# Patient Record
Sex: Female | Born: 1935 | Race: White | Hispanic: No | Smoking: Never smoker
Health system: Southern US, Community
[De-identification: ages and names within clinical notes are randomized; demographics above are authoritative.]

## PROBLEM LIST (undated history)

## (undated) DIAGNOSIS — M069 Rheumatoid arthritis, unspecified: Secondary | ICD-10-CM

## (undated) DIAGNOSIS — I495 Sick sinus syndrome: Secondary | ICD-10-CM

## (undated) DIAGNOSIS — I10 Essential (primary) hypertension: Secondary | ICD-10-CM

## (undated) DIAGNOSIS — I272 Pulmonary hypertension, unspecified: Secondary | ICD-10-CM

## (undated) DIAGNOSIS — M170 Bilateral primary osteoarthritis of knee: Secondary | ICD-10-CM

## (undated) DIAGNOSIS — E785 Hyperlipidemia, unspecified: Secondary | ICD-10-CM

## (undated) DIAGNOSIS — C50911 Malignant neoplasm of unspecified site of right female breast: Secondary | ICD-10-CM

## (undated) DIAGNOSIS — R32 Unspecified urinary incontinence: Secondary | ICD-10-CM

## (undated) DIAGNOSIS — G43909 Migraine, unspecified, not intractable, without status migrainosus: Secondary | ICD-10-CM

## (undated) DIAGNOSIS — H3530 Unspecified macular degeneration: Secondary | ICD-10-CM

## (undated) DIAGNOSIS — Z950 Presence of cardiac pacemaker: Secondary | ICD-10-CM

## (undated) DIAGNOSIS — E119 Type 2 diabetes mellitus without complications: Secondary | ICD-10-CM

## (undated) HISTORY — PX: BREAST BIOPSY: SHX20

## (undated) HISTORY — PX: OVARIAN CYST REMOVAL: SHX89

## (undated) HISTORY — PX: THIGH / KNEE SOFT TISSUE BIOPSY: SUR151

## (undated) HISTORY — PX: EYE SURGERY: SHX253

## (undated) HISTORY — PX: MASTECTOMY: SHX3

## (undated) HISTORY — PX: TONSILLECTOMY: SUR1361

## (undated) HISTORY — DX: Sick sinus syndrome: I49.5

## (undated) HISTORY — PX: BOWEL RESECTION: SHX1257

## (undated) HISTORY — DX: Essential (primary) hypertension: I10

## (undated) HISTORY — PX: FRACTURE SURGERY: SHX138

## (undated) HISTORY — PX: WRIST FRACTURE SURGERY: SHX121

## (undated) HISTORY — PX: ABDOMINAL HYSTERECTOMY: SHX81

## (undated) HISTORY — DX: Pulmonary hypertension, unspecified: I27.20

---

## 1997-11-27 ENCOUNTER — Emergency Department (HOSPITAL_COMMUNITY): Admission: EM | Admit: 1997-11-27 | Discharge: 1997-11-27 | Payer: Self-pay | Admitting: Emergency Medicine

## 1998-03-19 ENCOUNTER — Encounter: Payer: Self-pay | Admitting: Emergency Medicine

## 1998-03-19 ENCOUNTER — Emergency Department (HOSPITAL_COMMUNITY): Admission: EM | Admit: 1998-03-19 | Discharge: 1998-03-19 | Payer: Self-pay | Admitting: Emergency Medicine

## 1998-11-16 ENCOUNTER — Other Ambulatory Visit: Admission: RE | Admit: 1998-11-16 | Discharge: 1998-11-16 | Payer: Self-pay | Admitting: Endocrinology

## 2000-01-26 ENCOUNTER — Encounter: Payer: Self-pay | Admitting: Endocrinology

## 2000-01-26 ENCOUNTER — Encounter: Admission: RE | Admit: 2000-01-26 | Discharge: 2000-01-26 | Payer: Self-pay | Admitting: Endocrinology

## 2001-08-07 ENCOUNTER — Ambulatory Visit (HOSPITAL_COMMUNITY): Admission: RE | Admit: 2001-08-07 | Discharge: 2001-08-07 | Payer: Self-pay | Admitting: Ophthalmology

## 2003-03-04 ENCOUNTER — Encounter (INDEPENDENT_AMBULATORY_CARE_PROVIDER_SITE_OTHER): Payer: Self-pay | Admitting: Specialist

## 2003-03-04 ENCOUNTER — Ambulatory Visit (HOSPITAL_COMMUNITY): Admission: RE | Admit: 2003-03-04 | Discharge: 2003-03-04 | Payer: Self-pay | Admitting: *Deleted

## 2003-10-04 ENCOUNTER — Emergency Department (HOSPITAL_COMMUNITY): Admission: EM | Admit: 2003-10-04 | Discharge: 2003-10-04 | Payer: Self-pay | Admitting: Emergency Medicine

## 2003-10-09 HISTORY — PX: ORIF DISTAL RADIUS FRACTURE: SUR927

## 2003-10-22 ENCOUNTER — Ambulatory Visit (HOSPITAL_COMMUNITY): Admission: RE | Admit: 2003-10-22 | Discharge: 2003-10-23 | Payer: Self-pay | Admitting: Orthopedic Surgery

## 2003-10-22 ENCOUNTER — Ambulatory Visit (HOSPITAL_COMMUNITY): Admission: RE | Admit: 2003-10-22 | Discharge: 2003-10-22 | Payer: Self-pay | Admitting: Orthopedic Surgery

## 2003-10-22 ENCOUNTER — Encounter: Admission: RE | Admit: 2003-10-22 | Discharge: 2003-10-22 | Payer: Self-pay | Admitting: Orthopedic Surgery

## 2003-10-22 ENCOUNTER — Ambulatory Visit (HOSPITAL_BASED_OUTPATIENT_CLINIC_OR_DEPARTMENT_OTHER): Admission: RE | Admit: 2003-10-22 | Discharge: 2003-10-22 | Payer: Self-pay | Admitting: Orthopedic Surgery

## 2005-09-20 ENCOUNTER — Emergency Department (HOSPITAL_COMMUNITY): Admission: EM | Admit: 2005-09-20 | Discharge: 2005-09-20 | Payer: Self-pay | Admitting: Emergency Medicine

## 2007-06-28 ENCOUNTER — Other Ambulatory Visit: Admission: RE | Admit: 2007-06-28 | Discharge: 2007-06-28 | Payer: Self-pay | Admitting: Endocrinology

## 2009-04-17 ENCOUNTER — Emergency Department (HOSPITAL_COMMUNITY): Admission: EM | Admit: 2009-04-17 | Discharge: 2009-04-17 | Payer: Self-pay | Admitting: Emergency Medicine

## 2009-04-20 ENCOUNTER — Emergency Department (HOSPITAL_COMMUNITY): Admission: EM | Admit: 2009-04-20 | Discharge: 2009-04-20 | Payer: Self-pay | Admitting: Emergency Medicine

## 2009-10-09 ENCOUNTER — Emergency Department (HOSPITAL_COMMUNITY): Admission: EM | Admit: 2009-10-09 | Discharge: 2009-10-09 | Payer: Self-pay | Admitting: Emergency Medicine

## 2010-06-26 LAB — PROTIME-INR
INR: 2.46 — ABNORMAL HIGH (ref 0.00–1.49)
Prothrombin Time: 26.5 seconds — ABNORMAL HIGH (ref 11.6–15.2)

## 2010-06-26 LAB — URINE CULTURE: Colony Count: 60000

## 2010-06-26 LAB — CBC
HCT: 38.3 % (ref 36.0–46.0)
MCV: 88.5 fL (ref 78.0–100.0)
RBC: 4.33 MIL/uL (ref 3.87–5.11)
RDW: 15.4 % (ref 11.5–15.5)
WBC: 6.3 10*3/uL (ref 4.0–10.5)

## 2010-06-26 LAB — URINALYSIS, ROUTINE W REFLEX MICROSCOPIC
Glucose, UA: NEGATIVE mg/dL
Specific Gravity, Urine: 1.012 (ref 1.005–1.030)

## 2010-06-26 LAB — BASIC METABOLIC PANEL
CO2: 29 mEq/L (ref 19–32)
Calcium: 8.8 mg/dL (ref 8.4–10.5)
Creatinine, Ser: 1.42 mg/dL — ABNORMAL HIGH (ref 0.4–1.2)
GFR calc non Af Amer: 35 mL/min — ABNORMAL LOW (ref >60)

## 2010-06-26 LAB — DIFFERENTIAL
Basophils Absolute: 0 10*3/uL (ref 0.0–0.1)
Basophils Relative: 0 % (ref 0–1)
Eosinophils Relative: 1 % (ref 0–5)
Lymphs Abs: 1.2 10*3/uL (ref 0.7–4.0)
Monocytes Absolute: 0.5 10*3/uL (ref 0.1–1.0)
Neutrophils Relative %: 71 % (ref 43–77)

## 2010-06-26 LAB — POCT CARDIAC MARKERS
CKMB, poc: 1.4 ng/mL (ref 1.0–8.0)
Myoglobin, poc: 148 ng/mL (ref 12–200)

## 2010-06-26 LAB — APTT: aPTT: 42 seconds — ABNORMAL HIGH (ref 24–37)

## 2010-06-26 LAB — URINE MICROSCOPIC-ADD ON

## 2010-08-26 NOTE — Op Note (Signed)
NAMEMARISELA, LINE                            ACCOUNT NO.:  [id number]   MEDICAL RECORD NO.:  [id number]                   PATIENT TYPE:  AMB   LOCATION:  ENDO                                 FACILITY:  [facility]   PHYSICIAN:  Georgiana Spinner, M.D.                 DATE OF BIRTH:  1921/12/12   DATE OF PROCEDURE:  DATE OF DISCHARGE:                                 OPERATIVE REPORT   PROCEDURE:  Upper endoscopy.   INDICATIONS:  Abdominal pain.   ANESTHESIA:  Demerol 30 mg, Versed 3 mg.   DESCRIPTION OF PROCEDURE:  With the patient mildly sedated in the left  lateral decubitus position, the Olympus videoscopic endoscope was inserted  in the mouth and passed under direct vision through the esophagus, which  appeared normal.  We entered into the stomach, found a large hiatal hernia  which was photographed.  The  fundus, body, antrum, duodenal bulb, and  second portion of the duodenum were visualized, photographs taken.  From  this point, the endoscope was slowly withdrawn taking circumferential views  of the duodenal mucosa until the endoscope then pulled back into the  stomach, placed in retroflexion and viewed the stomach from below.  The  endoscope was then straightened, withdrawn, taking circumferential views of  the remaining gastric and esophageal mucosa, stopping in the fundus of the  stomach where areas of erythema and scarring and possibly some ulcerations  were seen, photographed and biopsied.  The patient's vital signs and pulse  oximetry remained stable.  The patient tolerated the procedure well without  apparent complication.   FINDINGS:  Gastritis with possible ulcerations and scarring seen.   PLAN:  Await biopsy report.  The patient will call me for results and follow  up with me as an outpatient.  Proceed with colonoscopy as planned.                                               Georgiana Spinner, M.D.    GMO/MEDQ  D:  03/04/2003  T:  03/04/2003  Job:  [id number]

## 2010-08-26 NOTE — Op Note (Signed)
Isabella Chen, Isabella Chen                            ACCOUNT NO.:  [id number]   MEDICAL RECORD NO.:  [id number]                   PATIENT TYPE:  AMB   LOCATION:  ENDO                                 FACILITY:  [facility]   PHYSICIAN:  Georgiana Spinner, M.D.                 DATE OF BIRTH:  Mar 25, 1922   DATE OF PROCEDURE:  DATE OF DISCHARGE:                                 OPERATIVE REPORT   PROCEDURE:  Colonoscopy.   INDICATIONS:  Colon cancer screening.   ANESTHESIA:  Demerol 20 mg, Versed 2.   DESCRIPTION OF PROCEDURE:  With the patient mildly sedated and in the left  lateral decubitus position, the Olympus videoscopic colonoscope was inserted  in the rectum and passed under direct vision through a tortuous colon to the  cecum, identified by ileocecal valve and appendiceal orifice, both of which  were photographed.  From this point,  the colonoscope was slowly withdrawn,  taking circumferential views of the colonic mucosa stopping only in the  rectum which appeared normal on direct and retroflexed view.  The endoscope  was straightened and withdrawn.  The patient's vital signs and pulse  oximetry remained stable.  The patient tolerated the procedure well without  apparent complications.   FINDINGS:  Unremarkable examination.  Of note, there were diverticulosis  seen in the sigmoid colon.   PLAN:  See endoscopy note for further details.                                               Georgiana Spinner, M.D.    GMO/MEDQ  D:  03/04/2003  T:  03/04/2003  Job:  [id number]

## 2010-08-26 NOTE — Op Note (Signed)
NAME:  Isabella Chen, Isabella Chen                          ACCOUNT NO.:  [id number]   MEDICAL RECORD NO.:  [id number]                   PATIENT TYPE:  OIB   LOCATION:  4713                                 FACILITY:  [facility]   PHYSICIAN:  Harvie Junior, M.D.                DATE OF BIRTH:  08/10/1921   DATE OF PROCEDURE:  10/22/2003  DATE OF DISCHARGE:                                 OPERATIVE REPORT   PREOPERATIVE DIAGNOSIS:  Distal radius fracture with a dorsal comminution  and settling.   POSTOPERATIVE DIAGNOSIS:  Distal radius fracture with a dorsal comminution  and settling.   PROCEDURE:  Open reduction and internal fixation of distal radius fracture  with cancellous bone grafting.   SURGEON:  Harvie Junior, M.D.   ANESTHESIA:  General.   INDICATIONS FOR PROCEDURE:  The patient is a 77 year old female with a long  history of reporting to the office with a distal radius fracture.  We  ultimately did manipulative closed reduction in the office and got near  anatomic reduction.  We talked about treatment options including the  possibility of fixation versus persistent splinting.  We initially attempted  splinting.  Because we followed her serially over two weeks and there was  significant collapse in the second week, unacceptable collapse had attained  and given this was the patient's dominant arm and she did live alone it was  felt that open reduction and internal fixation was the most appropriate  course of action. We felt that bone graft might be necessary.   DESCRIPTION OF PROCEDURE:  The patient was brought to the operating room.  After adequate anesthesia was obtained with general endotracheal, the  patient was placed on the operating table.  The left arm was prepped and  draped in the usual sterile fashion.  Following this, a linear incision was  made in the subcutaneous tissue dissected down to the level of the distal  radius fracture.  After the arm had been prepped and draped in  the usual  sterile fashion and exsanguinated with blood pressure tourniquet inflated to  250 mmHg and then incision was made over the flexor carpi radialis.  Subcutaneous tissue dissected down to the level of the FCR tendon which the  sheath was opened and the volar aspect of the tendon was then opened.  The  distal radius was then identified.  The fracture site was identified and  explored.  The nonunion was taken down.  Bone grafting was then packed in  the dorsal aspect and the wrist was then held in an anatomically reduced  position.  Once this had happened, the plating system was then chosen and  placed on the distal radius.  The pilot hole was drilled and the plate was  then placed anatomically on the distal radius.  Once this had been achieved,  the screws were locked in proximally and distally.  Excellent fixation  was  achieved.  Further bone grafting was taken in the defect.  At this point,  the wound was copiously irrigated and suctioned dry.  The subcutaneous was  closed with 4-0 Vicryl interrupted sutures and the skin was closed with a  combination of 4-0 Vicryl and 4-0 nylon sutures running horizontal mattress.  A sterile compressive dressing was then applied as well as volar plaster and  the patient taken to the recovery room where she was noted to be in  satisfactory condition.  Estimated blood loss for the procedure was none.                                               Harvie Junior, M.D.    Ranae Plumber  D:  10/22/2003  T:  10/22/2003  Job:  [id number]

## 2011-05-12 HISTORY — PX: A-V CARDIAC PACEMAKER INSERTION: SHX562

## 2011-05-16 ENCOUNTER — Other Ambulatory Visit: Payer: Self-pay

## 2011-05-16 ENCOUNTER — Inpatient Hospital Stay (HOSPITAL_COMMUNITY)
Admission: EM | Admit: 2011-05-16 | Discharge: 2011-05-21 | DRG: 243 | Disposition: A | Discharge status: Home Health | Payer: Medicare Other | Attending: Internal Medicine | Admitting: Internal Medicine

## 2011-05-16 ENCOUNTER — Encounter (HOSPITAL_COMMUNITY): Payer: Self-pay | Admitting: *Deleted

## 2011-05-16 ENCOUNTER — Emergency Department (HOSPITAL_COMMUNITY): Payer: Medicare Other

## 2011-05-16 DIAGNOSIS — Z7901 Long term (current) use of anticoagulants: Secondary | ICD-10-CM

## 2011-05-16 DIAGNOSIS — I495 Sick sinus syndrome: Principal | ICD-10-CM | POA: Diagnosis present

## 2011-05-16 DIAGNOSIS — E119 Type 2 diabetes mellitus without complications: Secondary | ICD-10-CM | POA: Diagnosis present

## 2011-05-16 DIAGNOSIS — H3530 Unspecified macular degeneration: Secondary | ICD-10-CM | POA: Diagnosis present

## 2011-05-16 DIAGNOSIS — I5032 Chronic diastolic (congestive) heart failure: Secondary | ICD-10-CM | POA: Diagnosis present

## 2011-05-16 DIAGNOSIS — E785 Hyperlipidemia, unspecified: Secondary | ICD-10-CM | POA: Diagnosis present

## 2011-05-16 DIAGNOSIS — R001 Bradycardia, unspecified: Secondary | ICD-10-CM | POA: Diagnosis present

## 2011-05-16 DIAGNOSIS — Z79899 Other long term (current) drug therapy: Secondary | ICD-10-CM

## 2011-05-16 DIAGNOSIS — R55 Syncope and collapse: Secondary | ICD-10-CM

## 2011-05-16 DIAGNOSIS — N289 Disorder of kidney and ureter, unspecified: Secondary | ICD-10-CM | POA: Diagnosis present

## 2011-05-16 DIAGNOSIS — I10 Essential (primary) hypertension: Secondary | ICD-10-CM | POA: Diagnosis present

## 2011-05-16 DIAGNOSIS — I4891 Unspecified atrial fibrillation: Secondary | ICD-10-CM

## 2011-05-16 DIAGNOSIS — Z853 Personal history of malignant neoplasm of breast: Secondary | ICD-10-CM

## 2011-05-16 LAB — DIFFERENTIAL
Basophils Absolute: 0 10*3/uL (ref 0.0–0.1)
Eosinophils Relative: 1 % (ref 0–5)
Lymphocytes Relative: 19 % (ref 12–46)
Monocytes Absolute: 0.7 10*3/uL (ref 0.1–1.0)

## 2011-05-16 LAB — CBC
HCT: 39.4 % (ref 36.0–46.0)
MCH: 26.6 pg (ref 26.0–34.0)
MCHC: 31 g/dL (ref 30.0–36.0)
MCV: 85.8 fL (ref 78.0–100.0)
RDW: 15.9 % — ABNORMAL HIGH (ref 11.5–15.5)
WBC: 8.4 10*3/uL (ref 4.0–10.5)

## 2011-05-16 LAB — COMPREHENSIVE METABOLIC PANEL
AST: 20 U/L (ref 0–37)
CO2: 28 mEq/L (ref 19–32)
Calcium: 9.6 mg/dL (ref 8.4–10.5)
Creatinine, Ser: 1.15 mg/dL — ABNORMAL HIGH (ref 0.50–1.10)
GFR calc Af Amer: 48 mL/min — ABNORMAL LOW (ref >90)
GFR calc non Af Amer: 42 mL/min — ABNORMAL LOW (ref >90)

## 2011-05-16 LAB — PROTIME-INR: INR: 2.03 — ABNORMAL HIGH (ref 0.00–1.49)

## 2011-05-16 NOTE — ED Provider Notes (Signed)
History     CSN: [id number]  Arrival date & time 05/16/11  4098   First MD Initiated Contact with Patient 05/16/11 2000      Chief Complaint  Patient presents with  . Dizziness    (Consider location/radiation/quality/duration/timing/severity/associated sxs/prior treatment) HPI History provided by pt and her son.  Pt has had eight episodes of presyncope today.  Describes as cloudiness of vision and lightheadedness.  Occurs at rest.  No associated CP, SOB, palpitations.  Family member checked VS during each episode and BP stable but HR jumped into 120s. No recent vomiting or diarrhea, has been drinking fluids and eating normally.  No recent fever, cough or urinary sx.  Has had similar sx once in the past and negative work-up by her PCP.  Pt has h/o diabetes, HTN and atrial fibrillation.  She is compliant with medications and no recent medication changes.  Pt had an isolated episode of CP here in ED while positioning herself in bed.  Resolved w/in a few minutes and no associated sx.    Past Medical History  Diagnosis Date  . Diabetes mellitus   . Macular degeneration syndrome     Past Surgical History  Procedure Date  . Mastectomy   . Bowel resection   . Abdominal hysterectomy   . Ovarian cyst removal   . Tonsillectomy     No family history on file.  History  Substance Use Topics  . Smoking status: Not on file  . Smokeless tobacco: Not on file  . Alcohol Use: No    OB History    Grav Para Term Preterm Abortions TAB SAB Ect Mult Living                  Review of Systems  All other systems reviewed and are negative.    Allergies  Review of patient's allergies indicates no known allergies.  Home Medications   Current Outpatient Rx  Name Route Sig Dispense Refill  . OMEGA-3 FATTY ACIDS 1000 MG PO CAPS Oral Take 2 g by mouth daily.    Marland Kitchen GLIMEPIRIDE 4 MG PO TABS Oral Take 4 mg by mouth daily before breakfast.    . METOPROLOL SUCCINATE ER 100 MG PO TB24 Oral Take  100 mg by mouth daily. Take with or immediately following a meal.    . ADULT MULTIVITAMIN W/MINERALS CH Oral Take 1 tablet by mouth daily.    Marland Kitchen PRESERVISION AREDS 2 PO Oral Take 2 capsules by mouth daily.    Marland Kitchen PANTOPRAZOLE SODIUM 40 MG PO TBEC Oral Take 40 mg by mouth daily.    Marland Kitchen POTASSIUM CHLORIDE CRYS ER 20 MEQ PO TBCR Oral Take 20 mEq by mouth daily.    Marland Kitchen ROSUVASTATIN CALCIUM 5 MG PO TABS Oral Take 5 mg by mouth daily.    . TORSEMIDE 100 MG PO TABS Oral Take 100 mg by mouth daily.    Marland Kitchen VALSARTAN-HYDROCHLOROTHIAZIDE 320-12.5 MG PO TABS Oral Take 1 tablet by mouth daily.    . WARFARIN SODIUM 3 MG PO TABS Oral Take 1.5-3 mg by mouth every evening. MWF-0.5 half tab (1.5 mg), All other days- 1 tab (3 mg)      BP 128/42  Pulse 58  Temp(Src) 97.7 F (36.5 C) (Oral)  Resp 17  Ht 5\' 3"  (1.6 m)  Wt 140 lb (63.504 kg)  BMI 24.80 kg/m2  SpO2 97%  Physical Exam  Nursing note and vitals reviewed. Constitutional: She is oriented to person, place, and time. She  appears well-developed and well-nourished. No distress.  HENT:  Head: Normocephalic and atraumatic.  Eyes:       Normal appearance  Neck: Normal range of motion.  Cardiovascular: Normal rate and regular rhythm.   Pulmonary/Chest: Effort normal and breath sounds normal.  Abdominal: Soft. Bowel sounds are normal. She exhibits no distension. There is no tenderness.  Musculoskeletal:       No LE edema or calf tenderness  Neurological: She is alert and oriented to person, place, and time.  Skin: Skin is warm and dry. No rash noted.  Psychiatric: She has a normal mood and affect. Her behavior is normal.    ED Course  Procedures (including critical care time)   Date: 05/16/2011  Rate: 60  Rhythm: normal sinus rhythm  QRS Axis: left  Intervals: normal  ST/T Wave abnormalities: normal  Conduction Disutrbances:left bundle branch block  Narrative Interpretation:   Old EKG Reviewed: none available   Date: 05/17/2011  Rate: 60   Rhythm: normal sinus rhythm  QRS Axis: left  Intervals: normal  ST/T Wave abnormalities: normal  Conduction Disutrbances:left bundle branch block  Narrative Interpretation: PVC  Old EKG Reviewed: unchanged   Date: 05/17/2011  Rate: 106  Rhythm: atrial fibrillation  QRS Axis: normal  Intervals: normal  ST/T Wave abnormalities: normal  Conduction Disutrbances:left bundle branch block  Narrative Interpretation:   Old EKG Reviewed: changes noted and atrial fibrillation    Labs Reviewed  CBC - Abnormal; Notable for the following:    RDW 15.9 (*)    All other components within normal limits  COMPREHENSIVE METABOLIC PANEL - Abnormal; Notable for the following:    Glucose, Bld 179 (*)    BUN 32 (*)    Creatinine, Ser 1.15 (*)    Albumin 3.2 (*)    Total Bilirubin 0.2 (*)    GFR calc non Af Amer 42 (*)    GFR calc Af Amer 48 (*)    All other components within normal limits  PROTIME-INR - Abnormal; Notable for the following:    Prothrombin Time 23.3 (*)    INR 2.03 (*)    All other components within normal limits  GLUCOSE, CAPILLARY - Abnormal; Notable for the following:    Glucose-Capillary 122 (*)    All other components within normal limits  DIFFERENTIAL  URINALYSIS, ROUTINE W REFLEX MICROSCOPIC   Dg Chest 2 View  05/16/2011  *RADIOLOGY REPORT*  Clinical Data: Dizziness, hypertension  CHEST - 2 VIEW  Comparison:  04/17/2009  Findings: Stable mild cardiomegaly.  Lungs are clear.  No effusion. Atheromatous aortic arch.  Mild thoracolumbar spondylitic changes. Small hiatal hernia.  IMPRESSION: 1.  Stable mild cardiomegaly.  No acute disease.  Original Report Authenticated By: Osa Craver, M.D.     1. Pre-syncope       MDM  78yo F w/ h/o atrial fibrillation and diabetes presents w/ c/o presyncope.  Denies CP, SOB, palpitation, recent illness/medication change.  Single episode of CP while positioning herself in bed, but has otherwise been asymptomatic in ED.  Pt  afebrile, well hydrated, heart w/ RRR, lungs CTA on exam.  EKG non-ischemic and shows NSR. Labs unremarkable w/ exception of troponin which is pending.  Dr. Alto Denver is in agreement that admission for cardiac monitoring would be appropriate d/t patient's age and number of episodes experienced today.    Troponin neg.  [facility] consulted for admission.  1:05 AM        Otilio Miu, PA 05/17/11 [phone]  Arie Sabina Amijah Timothy, [place] 05/17/11 1053

## 2011-05-16 NOTE — ED Notes (Signed)
No roooms available in POD A, will continue to monitor pt, NAD

## 2011-05-16 NOTE — ED Notes (Signed)
Pt. Reports having intermittent dizzy spells beginning around 17:00  During these spells she denies any pain or discomfort, or sob.  She does report feeling "washed out"

## 2011-05-16 NOTE — ED Notes (Signed)
Pt ambulated to the bathroom with 2 assists.

## 2011-05-16 NOTE — ED Provider Notes (Signed)
Patient seen in stretcher triage for several episodes of "feeling like I'm going to go out".  Her family member states that they checked her blood pressure and pulse during these episodes and noted normotensive but that her heart rate was as high as 130.  She has a history of atrial fibrillation which is being monitored by her PCP, Dr. Juleen China.  She denies actually passing out, reports no chest pain or shortness of breath during these episodes and denies any other complaints.  Alert, oriented, PERRLA, CN II-XII intact, heart RRR without RMG, lungs CTA bilaterally, abd soft and nttp, LE without edema or pain.  Initial orders placed and patient to be moved back to room for further evaluation.  Isabella Chen [place], [place] 05/16/11 2042

## 2011-05-17 ENCOUNTER — Encounter (HOSPITAL_COMMUNITY)
Admission: EM | Disposition: A | Discharge status: Home Health | Payer: Self-pay | Source: Home / Self Care | Attending: Internal Medicine

## 2011-05-17 ENCOUNTER — Encounter (HOSPITAL_COMMUNITY): Payer: Self-pay | Admitting: Internal Medicine

## 2011-05-17 ENCOUNTER — Other Ambulatory Visit: Payer: Self-pay

## 2011-05-17 DIAGNOSIS — R001 Bradycardia, unspecified: Secondary | ICD-10-CM | POA: Diagnosis present

## 2011-05-17 DIAGNOSIS — R55 Syncope and collapse: Secondary | ICD-10-CM

## 2011-05-17 DIAGNOSIS — I495 Sick sinus syndrome: Secondary | ICD-10-CM | POA: Diagnosis present

## 2011-05-17 DIAGNOSIS — I4891 Unspecified atrial fibrillation: Secondary | ICD-10-CM

## 2011-05-17 DIAGNOSIS — I10 Essential (primary) hypertension: Secondary | ICD-10-CM | POA: Diagnosis present

## 2011-05-17 HISTORY — PX: PERMANENT PACEMAKER INSERTION: SHX5480

## 2011-05-17 LAB — CBC
Hemoglobin: 11.8 g/dL — ABNORMAL LOW (ref 12.0–15.0)
MCH: 27 pg (ref 26.0–34.0)
MCH: 27 pg (ref 26.0–34.0)
MCV: 85 fL (ref 78.0–100.0)
MCV: 84.9 fL (ref 78.0–100.0)
Platelets: 209 10*3/uL (ref 150–400)
RBC: 4.37 MIL/uL (ref 3.87–5.11)
RDW: 16 % — ABNORMAL HIGH (ref 11.5–15.5)

## 2011-05-17 LAB — BASIC METABOLIC PANEL
BUN: 26 mg/dL — ABNORMAL HIGH (ref 6–23)
CO2: 26 mEq/L (ref 19–32)
Chloride: 102 mEq/L (ref 96–112)
Creatinine, Ser: 1.06 mg/dL (ref 0.50–1.10)

## 2011-05-17 LAB — CARDIAC PANEL(CRET KIN+CKTOT+MB+TROPI)
Relative Index: INVALID (ref 0.0–2.5)
Relative Index: INVALID (ref 0.0–2.5)
Troponin I: <0.3 ng/mL (ref <0.30)
Troponin I: <0.3 ng/mL (ref <0.30)
Troponin I: <0.3 ng/mL (ref <0.30)

## 2011-05-17 LAB — COMPREHENSIVE METABOLIC PANEL
AST: 18 U/L (ref 0–37)
Albumin: 3 g/dL — ABNORMAL LOW (ref 3.5–5.2)
Alkaline Phosphatase: 64 U/L (ref 39–117)
BUN: 28 mg/dL — ABNORMAL HIGH (ref 6–23)
Chloride: 105 mEq/L (ref 96–112)
Potassium: 3.9 mEq/L (ref 3.5–5.1)
Total Bilirubin: 0.3 mg/dL (ref 0.3–1.2)

## 2011-05-17 LAB — URINALYSIS, ROUTINE W REFLEX MICROSCOPIC
Ketones, ur: NEGATIVE mg/dL
Nitrite: NEGATIVE
Protein, ur: NEGATIVE mg/dL
Urobilinogen, UA: 0.2 mg/dL (ref 0.0–1.0)
pH: 6 (ref 5.0–8.0)

## 2011-05-17 LAB — GLUCOSE, CAPILLARY
Glucose-Capillary: 129 mg/dL — ABNORMAL HIGH (ref 70–99)
Glucose-Capillary: 111 mg/dL — ABNORMAL HIGH (ref 70–99)

## 2011-05-17 LAB — POCT I-STAT TROPONIN I

## 2011-05-17 LAB — MRSA PCR SCREENING: MRSA by PCR: NEGATIVE

## 2011-05-17 SURGERY — PERMANENT PACEMAKER INSERTION
Anesthesia: Moderate Sedation | Laterality: Left

## 2011-05-17 SURGERY — PERMANENT PACEMAKER INSERTION
Anesthesia: LOCAL

## 2011-05-17 MED ORDER — POTASSIUM CHLORIDE CRYS ER 20 MEQ PO TBCR: 20.0000 meq | EXTENDED_RELEASE_TABLET | Freq: Every day | ORAL | Status: DC

## 2011-05-17 MED ORDER — HYDROCODONE-ACETAMINOPHEN 5-325 MG PO TABS: 1.0000 | ORAL_TABLET | ORAL | Status: DC | PRN

## 2011-05-17 MED ORDER — LIDOCAINE HCL (PF) 1 % IJ SOLN
INTRAMUSCULAR | Status: AC
  Filled 2011-05-17: qty 60

## 2011-05-17 MED ORDER — ACETAMINOPHEN 650 MG RE SUPP: 650.0000 mg | Freq: Four times a day (QID) | RECTAL | Status: DC | PRN

## 2011-05-17 MED ORDER — SODIUM CHLORIDE 0.9 % IJ SOLN
3.0000 mL | Freq: Two times a day (BID) | INTRAMUSCULAR | Status: DC
  Administered 2011-05-17 – 2011-05-18 (×3): 3 mL via INTRAVENOUS
  Administered 2011-05-19: 10:00:00 via INTRAVENOUS
  Administered 2011-05-19 – 2011-05-21 (×3): 3 mL via INTRAVENOUS

## 2011-05-17 MED ORDER — VALSARTAN-HYDROCHLOROTHIAZIDE 320-12.5 MG PO TABS: 1.0000 | ORAL_TABLET | Freq: Every day | ORAL | Status: DC

## 2011-05-17 MED ORDER — SODIUM CHLORIDE 0.45 % IV SOLN: INTRAVENOUS | Status: DC

## 2011-05-17 MED ORDER — GLIMEPIRIDE 4 MG PO TABS
4.0000 mg | ORAL_TABLET | Freq: Every day | ORAL | Status: DC
  Administered 2011-05-17 – 2011-05-20 (×4): 4 mg via ORAL
  Filled 2011-05-17 (×6): qty 1

## 2011-05-17 MED ORDER — CHLORHEXIDINE GLUCONATE 4 % EX LIQD: 60.0000 mL | Freq: Once | CUTANEOUS | Status: DC

## 2011-05-17 MED ORDER — PANTOPRAZOLE SODIUM 40 MG PO TBEC
40.0000 mg | DELAYED_RELEASE_TABLET | Freq: Every day | ORAL | Status: DC
  Administered 2011-05-17 – 2011-05-21 (×5): 40 mg via ORAL
  Filled 2011-05-17 (×5): qty 1

## 2011-05-17 MED ORDER — TORSEMIDE 100 MG PO TABS
100.0000 mg | ORAL_TABLET | Freq: Every day | ORAL | Status: DC
  Filled 2011-05-17: qty 1

## 2011-05-17 MED ORDER — AMIODARONE HCL 200 MG PO TABS
200.0000 mg | ORAL_TABLET | Freq: Every day | ORAL | Status: DC
  Administered 2011-05-17 – 2011-05-20 (×4): 200 mg via ORAL
  Filled 2011-05-17 (×4): qty 1

## 2011-05-17 MED ORDER — ACETAMINOPHEN 325 MG PO TABS
325.0000 mg | ORAL_TABLET | ORAL | Status: DC | PRN
  Administered 2011-05-17: 650 mg via ORAL
  Filled 2011-05-17: qty 2

## 2011-05-17 MED ORDER — OMEGA-3-ACID ETHYL ESTERS 1 G PO CAPS
1.0000 g | ORAL_CAPSULE | Freq: Every day | ORAL | Status: DC
  Administered 2011-05-17 – 2011-05-21 (×5): 1 g via ORAL
  Filled 2011-05-17 (×5): qty 1

## 2011-05-17 MED ORDER — OMEGA-3 FATTY ACIDS 1000 MG PO CAPS: 2.0000 g | ORAL_CAPSULE | Freq: Every day | ORAL | Status: DC

## 2011-05-17 MED ORDER — HYDROCHLOROTHIAZIDE 12.5 MG PO CAPS
12.5000 mg | ORAL_CAPSULE | Freq: Every day | ORAL | Status: DC
  Filled 2011-05-17: qty 1

## 2011-05-17 MED ORDER — ROSUVASTATIN CALCIUM 5 MG PO TABS
5.0000 mg | ORAL_TABLET | Freq: Every day | ORAL | Status: DC
  Administered 2011-05-18 – 2011-05-20 (×4): 5 mg via ORAL
  Filled 2011-05-17 (×5): qty 1

## 2011-05-17 MED ORDER — DILTIAZEM HCL 50 MG/10ML IV SOLN: 10.0000 mg | Freq: Once | INTRAVENOUS | Status: DC

## 2011-05-17 MED ORDER — DILTIAZEM HCL 100 MG IV SOLR
5.0000 mg/h | INTRAVENOUS | Status: DC
  Filled 2011-05-17: qty 100

## 2011-05-17 MED ORDER — FENTANYL CITRATE 0.05 MG/ML IJ SOLN
INTRAMUSCULAR | Status: AC
  Filled 2011-05-17: qty 2

## 2011-05-17 MED ORDER — CEFAZOLIN SODIUM 1-5 GM-% IV SOLN
1.0000 g | Freq: Four times a day (QID) | INTRAVENOUS | Status: AC
  Administered 2011-05-17 – 2011-05-18 (×3): 1 g via INTRAVENOUS
  Filled 2011-05-17 (×4): qty 50

## 2011-05-17 MED ORDER — CEFAZOLIN SODIUM 1-5 GM-% IV SOLN
INTRAVENOUS | Status: AC
  Administered 2011-05-17: 1 g via INTRAVENOUS
  Filled 2011-05-17: qty 50

## 2011-05-17 MED ORDER — MIDAZOLAM HCL 2 MG/2ML IJ SOLN
INTRAMUSCULAR | Status: AC
  Filled 2011-05-17: qty 2

## 2011-05-17 MED ORDER — ONDANSETRON HCL 4 MG/2ML IJ SOLN: 4.0000 mg | Freq: Four times a day (QID) | INTRAMUSCULAR | Status: DC | PRN

## 2011-05-17 MED ORDER — SODIUM CHLORIDE 0.9 % IJ SOLN
3.0000 mL | Freq: Two times a day (BID) | INTRAMUSCULAR | Status: DC
  Administered 2011-05-17 – 2011-05-18 (×2): 3 mL via INTRAVENOUS
  Administered 2011-05-19: 10:00:00 via INTRAVENOUS
  Administered 2011-05-19: 3 mL via INTRAVENOUS

## 2011-05-17 MED ORDER — ONDANSETRON HCL 4 MG PO TABS: 4.0000 mg | ORAL_TABLET | Freq: Four times a day (QID) | ORAL | Status: DC | PRN

## 2011-05-17 MED ORDER — ACETAMINOPHEN 325 MG PO TABS: 650.0000 mg | ORAL_TABLET | Freq: Four times a day (QID) | ORAL | Status: DC | PRN

## 2011-05-17 MED ORDER — OLMESARTAN MEDOXOMIL 40 MG PO TABS
40.0000 mg | ORAL_TABLET | Freq: Every day | ORAL | Status: DC
  Administered 2011-05-17 – 2011-05-21 (×5): 40 mg via ORAL
  Filled 2011-05-17 (×5): qty 1

## 2011-05-17 MED ORDER — DILTIAZEM LOAD VIA INFUSION
10.0000 mg | Freq: Once | INTRAVENOUS | Status: AC
  Administered 2011-05-17: 10 mg via INTRAVENOUS
  Filled 2011-05-17: qty 10

## 2011-05-17 MED ORDER — SODIUM CHLORIDE 0.9 % IV SOLN: INTRAVENOUS | Status: DC

## 2011-05-17 MED ORDER — SODIUM CHLORIDE 0.9 % IR SOLN
80.0000 mg | Status: DC
  Filled 2011-05-17: qty 2

## 2011-05-17 MED ORDER — CEFAZOLIN SODIUM 1-5 GM-% IV SOLN
1.0000 g | INTRAVENOUS | Status: DC
  Filled 2011-05-17: qty 50

## 2011-05-17 MED ORDER — METOPROLOL TARTRATE 1 MG/ML IV SOLN
INTRAVENOUS | Status: AC
  Administered 2011-05-18: 5 mg
  Filled 2011-05-17: qty 5

## 2011-05-17 MED ORDER — HEPARIN (PORCINE) IN NACL 2-0.9 UNIT/ML-% IJ SOLN
INTRAMUSCULAR | Status: AC
  Filled 2011-05-17: qty 1000

## 2011-05-17 NOTE — ED Notes (Signed)
Pt had a 4 second pause. Pt is not going to get the cardizem gtt. Dr. Kirtland Bouchard was at the bedside when pt did this and is aware.

## 2011-05-17 NOTE — Consult Note (Signed)
Primary Cardiologist: None  CC: Near syncope and 5 seconds pause  History of Present Illness: Isabella Chen is a 78 y.o. female with a past medical history significant for atrial fibrillation on anticoagulation who presents in consultation for near syncope and 5 second pause.  She cannot tell me much about her atrial fibrillation history but does report being on coumadin for years.  A couple of years ago, she moved in with her son because of spells of near syncope.  Today, she had 8 episodes where she almost passed out.  She denies other symptoms (chest pain, shortness of breath, palpitations, CHF, etc.).  In the ED, she was noted to have PAF with ventricular rates in the 110s.  On tele, a 5 second pause was captures; I assume that this was a post-conversion pause, however, the entire strips are unavailable.   PMH: 1. HTN 2. DM 3. Atrial Fibrillation on coumadin  SOCIAL HISTORY: The patient denies tobacco, alcohol or drug use. She lives with her son.  FAMILY HISTORY: There is no family history of pacemakers.   REVIEW OF SYSTEMS: All systems reviewed and are negative except as mentioned above in the HPI.    HOME MEDICATIONS: fish oil, amaryl, toprol XL 100 daily, MVI, protonix, potassium, crestor, demadex 100mg  daily, diovan-HCT, coumadin   ALLERGY: No Known Allergies   PHYSICAL EXAMINATION: Filed Vitals:   05/17/11 0400  BP: 146/67  Pulse: 69  Temp:   Resp: 22     GENERAL: well developed, well nourished, no acute distress EYES: PERRL, EOMI ENT: Oropharynx is clear.  Dentition is within normal limits NECK:  There is 7cm JVD on the right side HEART: RRR with no murmurs, rubs, or gallops LUNGS: Clear to auscultation bilaterally ABDOMEN:  +BS, soft, NTND EXTREMITIES:  1-2+ LEE B. NEUROLOGIC: AOx3, no focal deficits SKIN: No rashes PSYCH:  Normal judgment and insight, mood is appropriate.  EKG:  1. AF with LBBB and ventricular rate of 110. 2. Tele in ED demonstrates 5 second  pause, likely after converting from atrial fibrillation to sinus rhythm.  3. Currently, NSR on the monitor.    Results for orders placed during the hospital encounter of 05/16/11 (from the past 24 hour(s))  CBC     Status: Abnormal   Collection Time   05/16/11  8:23 PM      Component Value Range   WBC 8.4  4.0 - 10.5 (K/uL)   RBC 4.59  3.87 - 5.11 (MIL/uL)   Hemoglobin 12.2  12.0 - 15.0 (g/dL)   HCT 16.1  09.6 - 04.5 (%)   MCV 85.8  78.0 - 100.0 (fL)   MCH 26.6  26.0 - 34.0 (pg)   MCHC 31.0  30.0 - 36.0 (g/dL)   RDW 40.9 (*) 81.1 - 15.5 (%)   Platelets 225  150 - 400 (K/uL)  DIFFERENTIAL     Status: Normal   Collection Time   05/16/11  8:23 PM      Component Value Range   Neutrophils Relative 71  43 - 77 (%)   Neutro Abs 5.9  1.7 - 7.7 (K/uL)   Lymphocytes Relative 19  12 - 46 (%)   Lymphs Abs 1.6  0.7 - 4.0 (K/uL)   Monocytes Relative 8  3 - 12 (%)   Monocytes Absolute 0.7  0.1 - 1.0 (K/uL)   Eosinophils Relative 1  0 - 5 (%)   Eosinophils Absolute 0.1  0.0 - 0.7 (K/uL)   Basophils Relative 0  0 - 1 (%)   Basophils Absolute 0.0  0.0 - 0.1 (K/uL)  COMPREHENSIVE METABOLIC PANEL     Status: Abnormal   Collection Time   05/16/11  8:23 PM      Component Value Range   Sodium 140  135 - 145 (mEq/L)   Potassium 4.3  3.5 - 5.1 (mEq/L)   Chloride 104  96 - 112 (mEq/L)   CO2 28  19 - 32 (mEq/L)   Glucose, Bld 179 (*) 70 - 99 (mg/dL)   BUN 32 (*) 6 - 23 (mg/dL)   Creatinine, Ser 4.09 (*) 0.50 - 1.10 (mg/dL)   Calcium 9.6  8.4 - 81.1 (mg/dL)   Total Protein 6.8  6.0 - 8.3 (g/dL)   Albumin 3.2 (*) 3.5 - 5.2 (g/dL)   AST 20  0 - 37 (U/L)   ALT 16  0 - 35 (U/L)   Alkaline Phosphatase 65  39 - 117 (U/L)   Total Bilirubin 0.2 (*) 0.3 - 1.2 (mg/dL)   GFR calc non Af Amer 42 (*) >90 (mL/min)   GFR calc Af Amer 48 (*) >90 (mL/min)  PROTIME-INR     Status: Abnormal   Collection Time   05/16/11  8:23 PM      Component Value Range   Prothrombin Time 23.3 (*) 11.6 - 15.2 (seconds)   INR 2.03  (*) 0.00 - 1.49   GLUCOSE, CAPILLARY     Status: Abnormal   Collection Time   05/16/11 10:09 PM      Component Value Range   Glucose-Capillary 122 (*) 70 - 99 (mg/dL)  URINALYSIS, ROUTINE W REFLEX MICROSCOPIC     Status: Abnormal   Collection Time   05/16/11 11:25 PM      Component Value Range   Color, Urine YELLOW  YELLOW    APPearance CLEAR  CLEAR    Specific Gravity, Urine 1.014  1.005 - 1.030    pH 6.0  5.0 - 8.0    Glucose, UA 100 (*) NEGATIVE (mg/dL)   Hgb urine dipstick NEGATIVE  NEGATIVE    Bilirubin Urine NEGATIVE  NEGATIVE    Ketones, ur NEGATIVE  NEGATIVE (mg/dL)   Protein, ur NEGATIVE  NEGATIVE (mg/dL)   Urobilinogen, UA 0.2  0.0 - 1.0 (mg/dL)   Nitrite NEGATIVE  NEGATIVE    Leukocytes, UA MODERATE (*) NEGATIVE   URINE MICROSCOPIC-ADD ON     Status: Abnormal   Collection Time   05/16/11 11:25 PM      Component Value Range   Squamous Epithelial / LPF MANY (*) RARE    WBC, UA 3-6  <3 (WBC/hpf)   RBC / HPF 0-2  <3 (RBC/hpf)   Urine-Other MUCOUS PRESENT    POCT I-STAT TROPONIN I     Status: Normal   Collection Time   05/17/11 12:22 AM      Component Value Range   Troponin i, poc 0.01  0.00 - 0.08 (ng/mL)   Comment 3           GLUCOSE, CAPILLARY     Status: Abnormal   Collection Time   05/17/11  5:05 AM      Component Value Range   Glucose-Capillary 115 (*) 70 - 99 (mg/dL)   Dg Chest 2 View  12/09/4780  *RADIOLOGY REPORT*  Clinical Data: Dizziness, hypertension  CHEST - 2 VIEW  Comparison:  04/17/2009  Findings: Stable mild cardiomegaly.  Lungs are clear.  No effusion. Atheromatous aortic arch.  Mild thoracolumbar spondylitic changes. Small  hiatal hernia.  IMPRESSION: 1.  Stable mild cardiomegaly.  No acute disease.  Original Report Authenticated By: Osa Craver, M.D.    ASSESSMENT AND PLAN: Isabella Chen is a 78 y.o. female with a past medical history significant for atrial fibrillation on anticoagulation who presents with 8 episodes of near syncope and was found  to be in paroxsymal atrial fibrillation with intermittent pauses of up to 5 seconds.   1: Paroxsysmal Atrial Fibrillation - her near syncope episodes are most likely related to post-conversion pauses to sinus rhythm.  Given the fact that she is symptomatic and needs beta-blockers for management of AF RVR, recommend evaluation for pacemaker (tachy-brady syndrome). Continue toprol for now. Hold coumadin and start heparin gtt when INR <2.    2: Probable chronic diastolic heart failure - check BNP and echo.  Continue BP meds and diuretics.    3: [facility] will follow along with you  Bernestine Amass. Mayford Knife, MD Level 5 Consult

## 2011-05-17 NOTE — H&P (View-Only) (Signed)
Patient ID: Isabella Chen, female   DOB: 05/12/1921, 78 y.o.   MRN: [id number] Subjective:  C/o dizziness.  Objective:  Vital Signs in the last 24 hours: Temp:  [97.7 F (36.5 C)-98.4 F (36.9 C)] 98 F (36.7 C) (02/06 1200) Pulse Rate:  [54-120] 120  (02/06 1000) Resp:  [13-22] 21  (02/06 1000) BP: (107-170)/(40-101) 112/98 mmHg (02/06 1000) SpO2:  [94 %-99 %] 96 % (02/06 1000) Weight:  [63.504 kg (140 lb)-65 kg (143 lb 4.8 oz)] 65 kg (143 lb 4.8 oz) (02/06 0800)  Intake/Output from previous day: 02/05 0701 - 02/06 0700 In: 480 [P.O.:480] Out: 400 [Urine:400] Intake/Output from this shift: Total I/O In: -  Out: 1000 [Urine:1000]  Physical Exam: Well appearing elderly woman, NAD HEENT: Unremarkable Neck:  No JVD, no thyromegally Lungs:  Clear with no wheezes HEART:  Regular rate rhythm, no murmurs, no rubs, no clicks Abd:  Flat, positive bowel sounds, no organomegally, no rebound, no guarding Ext:  2 plus pulses, no edema, no cyanosis, no clubbing Skin:  No rashes no nodules Neuro:  CN II through XII intact, motor grossly intact  Lab Results:  Basename 05/17/11 0913 05/17/11 0530  WBC 7.8 8.5  HGB 11.8* 11.7*  PLT 218 209    Basename 05/17/11 0913 05/17/11 0530  NA 138 141  K 4.0 3.9  CL 102 105  CO2 26 28  GLUCOSE 149* 119*  BUN 26* 28*  CREATININE 1.06 1.10    Basename 05/17/11 1149 05/17/11 0530  TROPONINI <0.30 <0.30   Hepatic Function Panel  Basename 05/17/11 0530  PROT 6.7  ALBUMIN 3.0*  AST 18  ALT 14  ALKPHOS 64  BILITOT 0.3  BILIDIR --  IBILI --   No results found for this basename: CHOL in the last 72 hours No results found for this basename: PROTIME in the last 72 hours  Imaging: Dg Chest 2 View  05/16/2011  *RADIOLOGY REPORT*  Clinical Data: Dizziness, hypertension  CHEST - 2 VIEW  Comparison:  04/17/2009  Findings: Stable mild cardiomegaly.  Lungs are clear.  No effusion. Atheromatous aortic arch.  Mild thoracolumbar spondylitic  changes. Small hiatal hernia.  IMPRESSION: 1.  Stable mild cardiomegaly.  No acute disease.  Original Report Authenticated By: D. DANIEL HASSELL III, M.D.    Cardiac Studies: Atrial fib with sinus brady and long pauses. Assessment/Plan:  1. Symptomatic tachybrady 2. LBBB 3. PAF Rec: I have recommended proceeding with PPM. The risks/benefits/goals/expectations of PPM have been discussed the patient and he wishes to proceed.  LOS: 1 day    Edis Huish 05/17/2011, 12:56 PM     

## 2011-05-17 NOTE — Op Note (Signed)
DDD PPM inserted via left subclavian vein. [id number].

## 2011-05-17 NOTE — Progress Notes (Signed)
ANTICOAGULATION CONSULT NOTE - Follow Up Consult  Pharmacy Consult for heparin Indication: atrial fibrillation  No Known Allergies  Patient Measurements: Height: 5\' 3"  (160 cm) Weight: 143 lb 4.8 oz (65 kg) IBW/kg (Calculated) : 52.4   Vital Signs: Temp: 97.8 F (36.6 C) (02/06 0800) Temp src: Oral (02/06 0800) BP: 134/58 mmHg (02/06 0800) Pulse Rate: 57  (02/06 0800)  Labs:  Basename 05/17/11 0530 05/16/11 2023  HGB 11.7* 12.2  HCT 36.9 39.4  PLT 209 225  APTT -- --  LABPROT 23.2* 23.3*  INR 2.02* 2.03*  HEPARINUNFRC -- --  CREATININE 1.10 1.15*  CKTOTAL 53 --  CKMB 3.1 --  TROPONINI <0.30 --   Estimated Creatinine Clearance: 32.6 ml/min (by C-G formula based on Cr of 1.1).  Medical History: Past Medical History  Diagnosis Date  . Diabetes mellitus   . Macular degeneration syndrome   . Dysrhythmia     Medications:  Prescriptions prior to admission  Medication Sig Dispense Refill  . fish oil-omega-3 fatty acids 1000 MG capsule Take 2 g by mouth daily.      Marland Kitchen glimepiride (AMARYL) 4 MG tablet Take 4 mg by mouth daily before breakfast.      . metoprolol succinate (TOPROL-XL) 100 MG 24 hr tablet Take 100 mg by mouth daily. Take with or immediately following a meal.      . Multiple Vitamin (MULITIVITAMIN WITH MINERALS) TABS Take 1 tablet by mouth daily.      . Multiple Vitamins-Minerals (PRESERVISION AREDS 2 PO) Take 2 capsules by mouth daily.      . pantoprazole (PROTONIX) 40 MG tablet Take 40 mg by mouth daily.      . potassium chloride SA (K-DUR,KLOR-CON) 20 MEQ tablet Take 20 mEq by mouth daily.      . rosuvastatin (CRESTOR) 5 MG tablet Take 5 mg by mouth daily.      Marland Kitchen torsemide (DEMADEX) 100 MG tablet Take 100 mg by mouth daily.      . valsartan-hydrochlorothiazide (DIOVAN-HCT) 320-12.5 MG per tablet Take 1 tablet by mouth daily.      Marland Kitchen warfarin (COUMADIN) 3 MG tablet Take 1.5-3 mg by mouth every evening. MWF-0.5 half tab (1.5 mg), All other days- 1 tab (3 mg)       Note took dose on 05/16/11 pm in the ED (used her home supply)  Scheduled:     . diltiazem  10 mg Intravenous Once  . glimepiride  4 mg Oral QAC breakfast  . hydrochlorothiazide  12.5 mg Oral Daily  . olmesartan  40 mg Oral Daily  . omega-3 acid ethyl esters  1 g Oral Daily  . pantoprazole  40 mg Oral Q1200  . potassium chloride SA  20 mEq Oral Daily  . rosuvastatin  5 mg Oral q1800  . sodium chloride  3 mL Intravenous Q12H  . sodium chloride  3 mL Intravenous Q12H  . torsemide  100 mg Oral Daily  . DISCONTD: diltiazem (CARDIZEM) infusion  5-15 mg/hr Intravenous To Major  . DISCONTD: diltiazem  10 mg Intravenous Once  . DISCONTD: fish oil-omega-3 fatty acids  2 g Oral Daily  . DISCONTD: valsartan-hydrochlorothiazide  1 tablet Oral Daily    Admit Complaint:  78yo female presents with near-syncopal episodes thought to be due to tachybrady syndrome. Pharmacy consulted to start heparin when INR<2.  Warfarin PTA dose: warfarin (COUMADIN) 3 MG tablet Take 1.5-3 mg by mouth every evening. MWF-0.5 half tab (1.5 mg), All other days- 1 tab (3  mg)   Assessment: Anticoagulation: Paroxysmal atrial fibrillation: heparin when INR < goal, received home dose last night in ED (took her own supply), INR stable,  Will hold on heparin initiation anticipate start tomorrow am.  Cardiovascular: Paroxysmal atrial fibrillation, HTN: Evaluation ongoing for pacer due to tachy/brady syndrome bradycardic, BP at goal, olmesartan, lovaza, crestor Endocrinology: Diabetes: amaryl, gulcose < 150 Gastrointestinal / Nutrition: protonix PTA Medication Issues: Home medications not ordered:  Hydrochlorothiazide, metoprolol XL, torsemide, KCl 20 mEq daily, MVI. Best Practices: DVT Prophylaxis: INR at goal  Goal of Therapy:  Heparin level 0.3-0.7 units/ml   Plan:  Follow up INR with am Labs 05/18/11 and initiate heparin if < 2   3Juliette Merrily Brittle PharmD BCPS 05/17/2011,8:48 AM

## 2011-05-17 NOTE — Progress Notes (Signed)
Patient ID: Isabella Chen, female   DOB: 06-02-1921, 78 y.o.   MRN: [id number] Subjective:  C/o dizziness.  Objective:  Vital Signs in the last 24 hours: Temp:  [97.7 F (36.5 C)-98.4 F (36.9 C)] 98 F (36.7 C) (02/06 1200) Pulse Rate:  [54-120] 120  (02/06 1000) Resp:  [13-22] 21  (02/06 1000) BP: (107-170)/(40-101) 112/98 mmHg (02/06 1000) SpO2:  [94 %-99 %] 96 % (02/06 1000) Weight:  [63.504 kg (140 lb)-65 kg (143 lb 4.8 oz)] 65 kg (143 lb 4.8 oz) (02/06 0800)  Intake/Output from previous day: 02/05 0701 - 02/06 0700 In: 480 [P.O.:480] Out: 400 [Urine:400] Intake/Output from this shift: Total I/O In: -  Out: 1000 [Urine:1000]  Physical Exam: Well appearing elderly woman, NAD HEENT: Unremarkable Neck:  No JVD, no thyromegally Lungs:  Clear with no wheezes HEART:  Regular rate rhythm, no murmurs, no rubs, no clicks Abd:  Flat, positive bowel sounds, no organomegally, no rebound, no guarding Ext:  2 plus pulses, no edema, no cyanosis, no clubbing Skin:  No rashes no nodules Neuro:  CN II through XII intact, motor grossly intact  Lab Results:  Basename 05/17/11 0913 05/17/11 0530  WBC 7.8 8.5  HGB 11.8* 11.7*  PLT 218 209    Basename 05/17/11 0913 05/17/11 0530  NA 138 141  K 4.0 3.9  CL 102 105  CO2 26 28  GLUCOSE 149* 119*  BUN 26* 28*  CREATININE 1.06 1.10    Basename 05/17/11 1149 05/17/11 0530  TROPONINI <0.30 <0.30   Hepatic Function Panel  Basename 05/17/11 0530  PROT 6.7  ALBUMIN 3.0*  AST 18  ALT 14  ALKPHOS 64  BILITOT 0.3  BILIDIR --  IBILI --   No results found for this basename: CHOL in the last 72 hours No results found for this basename: PROTIME in the last 72 hours  Imaging: Dg Chest 2 View  05/16/2011  *RADIOLOGY REPORT*  Clinical Data: Dizziness, hypertension  CHEST - 2 VIEW  Comparison:  04/17/2009  Findings: Stable mild cardiomegaly.  Lungs are clear.  No effusion. Atheromatous aortic arch.  Mild thoracolumbar spondylitic  changes. Small hiatal hernia.  IMPRESSION: 1.  Stable mild cardiomegaly.  No acute disease.  Original Report Authenticated By: Osa Craver, M.D.    Cardiac Studies: Atrial fib with sinus brady and long pauses. Assessment/Plan:  1. Symptomatic tachybrady 2. LBBB 3. PAF Rec: I have recommended proceeding with PPM. The risks/benefits/goals/expectations of PPM have been discussed the patient and he wishes to proceed.  LOS: 1 day    Lewayne Bunting 05/17/2011, 12:56 PM

## 2011-05-17 NOTE — Interval H&P Note (Signed)
History and Physical Interval Note:  05/17/2011 12:59 PM  Isabella Chen  has presented today for surgery, with the diagnosis of eri  The various methods of treatment have been discussed with the patient and family. After consideration of risks, benefits and other options for treatment, the patient has consented to  Procedure(s): PERMANENT PACEMAKER INSERTION as a surgical intervention .  The patients' history has been reviewed, patient examined, no change in status, stable for surgery.  I have reviewed the patients' chart and labs.  Questions were answered to the patient's satisfaction.     Lewayne Bunting

## 2011-05-17 NOTE — Progress Notes (Signed)
[facility]  Subjective: Alert and oriented. Patient is aware when she has bradycardic pauses after she comes out of her tachycardia arrhythmia. She notices shortness of breath and a dizziness sensation. No other complaints verbalize. Was sitting upright in the bed and eating breakfast when I evaluated earlier this morning.  Objective: Vital signs in last 24 hours: Temp:  [97.7 F (36.5 C)-98.4 F (36.9 C)] 97.8 F (36.6 C) (02/06 0800) Pulse Rate:  [54-120] 120  (02/06 1000) Resp:  [13-22] 21  (02/06 1000) BP: (107-170)/(40-101) 112/98 mmHg (02/06 1000) SpO2:  [94 %-99 %] 96 % (02/06 1000) Weight:  [63.504 kg (140 lb)-65 kg (143 lb 4.8 oz)] 65 kg (143 lb 4.8 oz) (02/06 0800) Weight change:  Last BM Date: 05/16/11  Intake/Output from previous day: 02/05 0701 - 02/06 0700 In: 480 [P.O.:480] Out: 400 [Urine:400] Intake/Output this shift: Total I/O In: -  Out: 1000 [Urine:1000]  F/U exam completed  Lab Results:  [facility] 05/17/11 0913 05/17/11 0530  WBC 7.8 8.5  HGB 11.8* 11.7*  HCT 37.1 36.9  PLT 218 209   BMET  Basename 05/17/11 0913 05/17/11 0530  NA 138 141  K 4.0 3.9  CL 102 105  CO2 26 28  GLUCOSE 149* 119*  BUN 26* 28*  CREATININE 1.06 1.10  CALCIUM 9.6 9.4    Studies/Results: Dg Chest 2 View  05/16/2011  *RADIOLOGY REPORT*  Clinical Data: Dizziness, hypertension  CHEST - 2 VIEW  Comparison:  04/17/2009  Findings: Stable mild cardiomegaly.  Lungs are clear.  No effusion. Atheromatous aortic arch.  Mild thoracolumbar spondylitic changes. Small hiatal hernia.  IMPRESSION: 1.  Stable mild cardiomegaly.  No acute disease.  Original Report Authenticated By: Osa Craver, M.D.    Medications:  I have reviewed the patient's current medications.  Assessment/Plan:   *Symptomatic bradycardia Cardiology fellow evaluated this patient overnight and I have confirmed that [facility] cardiology will also see the patient today. They've been made aware of  her increasing frequency of bradycardic symptomatic pauses. The Zoll pacemaker is at the bedside.   Near syncope Likely related to symptomatic bradycardia. We'll allow to mobilize but only with assistance.   Tachy-brady syndrome/A-fib Has known chronic atrial fibrillation for many years and has been on chronic systemic Coumadin anticoagulation. Also has a history of near syncopal well several years ago which prompted her to move in with her son. In the past 24 hours has had more frequent spells and according to the H&P had 8 spells prior to arrival to the ER on 05/16/2011. Required beta blocker therapy for rate control prior to admission. Currently is off any rate control agents because of the symptomatic sinus pauses with bradycardia. Await cardiology recommendations. Likely will need pacemaker placement. TSH is normal at 1.224.  Chronic systemic anticoagulation We will ask pharmacy to manage anticoagulation. Currently Coumadin is on hold in anticipation of likely pacemaker placement. Plan to initiate full dose IV heparin when INR status and 2.0. Current INR is 2.02.  Diabetes mellitus Continue glimepiride and sliding scale insulin with a.c. and hour sleep CBG checks. CBG's have been well controlled between 77 and 149 since admission.   HTN (hypertension) Blood pressure is well-controlled although decreases into the low 100s when either heart rate less than 60 or heart rate greater than 110. Preadmission diuretics have been stopped but she remains on her ARB agent.  Dyslipidemia Continue omega-3 fatty acids as well as Crestor.   Disposition Remain in step down due to symptomatic  bradycardic pauses and possibility she may require emergent transvenous pacemaker placement which would subsequently upgrade her to ICU status.   LOS: 1 day   Junious Silk, ANP pager [phone]  [facility] 8 [email or web address] Password: [email or web address]  05/17/2011, 11:35 AM  I have personally examined this  patient and reviewed the entire database. I have reviewed the above note, made any necessary editorial changes, and agree with its content.  Lonia Blood, MD [facility]

## 2011-05-17 NOTE — ED Provider Notes (Addendum)
Patient developed increasing frequency of episodes of a-fib with RVR.  I notified the admitting MD and we agreed to start diltiazem.  Patient was given one 10 mg IV bolus. Internist preferred not to start infusion at this time.  Patient will be upgraded to step down bed. While admitting physician was at bedside patient had a 5 second sinus pause. He contacted cardiology. They will be evaluating the patient that she will still be admitted to internal medicine for further management. Cyndra Numbers, MD 05/17/11 1610  Cyndra Numbers, MD 05/17/11 [phone]

## 2011-05-17 NOTE — Progress Notes (Signed)
ANTICOAGULATION CONSULT NOTE - Initial Consult  Pharmacy Consult for heparin Indication: atrial fibrillation  No Known Allergies  Patient Measurements: Height: 5\' 3"  (160 cm) Weight: 140 lb (63.504 kg) IBW/kg (Calculated) : 52.4   Vital Signs: Temp: 97.7 F (36.5 C) (02/05 2033) Temp src: Oral (02/05 2033) BP: 107/40 mmHg (02/06 0300) Pulse Rate: 54  (02/06 0300)  Labs:  Basename 05/16/11 2023  HGB 12.2  HCT 39.4  PLT 225  APTT --  LABPROT 23.3*  INR 2.03*  HEPARINUNFRC --  CREATININE 1.15*  CKTOTAL --  CKMB --  TROPONINI --   Estimated Creatinine Clearance: 30.9 ml/min (by C-G formula based on Cr of 1.15).  Medical History: Past Medical History  Diagnosis Date  . Diabetes mellitus   . Macular degeneration syndrome   . Dysrhythmia     Medications:  Prescriptions prior to admission  Medication Sig Dispense Refill  . fish oil-omega-3 fatty acids 1000 MG capsule Take 2 g by mouth daily.      Marland Kitchen glimepiride (AMARYL) 4 MG tablet Take 4 mg by mouth daily before breakfast.      . metoprolol succinate (TOPROL-XL) 100 MG 24 hr tablet Take 100 mg by mouth daily. Take with or immediately following a meal.      . Multiple Vitamin (MULITIVITAMIN WITH MINERALS) TABS Take 1 tablet by mouth daily.      . Multiple Vitamins-Minerals (PRESERVISION AREDS 2 PO) Take 2 capsules by mouth daily.      . pantoprazole (PROTONIX) 40 MG tablet Take 40 mg by mouth daily.      . potassium chloride SA (K-DUR,KLOR-CON) 20 MEQ tablet Take 20 mEq by mouth daily.      . rosuvastatin (CRESTOR) 5 MG tablet Take 5 mg by mouth daily.      Marland Kitchen torsemide (DEMADEX) 100 MG tablet Take 100 mg by mouth daily.      . valsartan-hydrochlorothiazide (DIOVAN-HCT) 320-12.5 MG per tablet Take 1 tablet by mouth daily.      Marland Kitchen warfarin (COUMADIN) 3 MG tablet Take 1.5-3 mg by mouth every evening. MWF-0.5 half tab (1.5 mg), All other days- 1 tab (3 mg)       Scheduled:    . diltiazem  10 mg Intravenous Once  .  glimepiride  4 mg Oral QAC breakfast  . omega-3 acid ethyl esters  1 g Oral Daily  . pantoprazole  40 mg Oral Q1200  . potassium chloride SA  20 mEq Oral Daily  . rosuvastatin  5 mg Oral q1800  . sodium chloride  3 mL Intravenous Q12H  . sodium chloride  3 mL Intravenous Q12H  . torsemide  100 mg Oral Daily  . valsartan-hydrochlorothiazide  1 tablet Oral Daily  . DISCONTD: diltiazem (CARDIZEM) infusion  5-15 mg/hr Intravenous To Major  . DISCONTD: diltiazem  10 mg Intravenous Once  . DISCONTD: fish oil-omega-3 fatty acids  2 g Oral Daily    Assessment: 78yo female presents with near-syncopal episodes thought to be due to tachybrady syndrome, on Coumadin PTA for Afib, to begin heparin when INR<2.   Goal of Therapy:  Heparin level 0.3-0.7 units/ml   Plan:  Will begin heparin when INR<2 (was 2.03 8h ago; will obtain another INR this morning to see whether trending up or down).  Colleen Can PharmD BCPS 05/17/2011,4:09 AM

## 2011-05-17 NOTE — ED Notes (Signed)
Pt son is Markesha Hannig and his numbers are [phone] is home and cell # is [phone].

## 2011-05-17 NOTE — H&P (Signed)
Isabella Chen is an 78 y.o. female.   PCP - Dr.Kohut Chief Complaint: Almost passed out multiple times. HPI: 78 year-old female with history of atrial fibrillation, diabetes mellitus type 2, history of breast cancer status post mastectomy in the 90s, hypertension was experiencing multiple episodes of passing out like spells. Her daughter when she checked her heart rate was found to be high. Patient was brought to the ER. In the ER patient's heart rate was found to be going from 60s to 120. In A. fib. When her heart rate became more sustained 130s the ER physician did give Cardizem bolus 10 mg after which her heart rate decreased to 80s but still in A. fib. At that time patient also had a fine second pause when patient also felt similar experience of passing out like. At this time I have consulted cardiologist on call for [place] Dr. Gwendalyn Ege who will be seeing patient in consult. Patient will be admitted to step down unit. Patient have to come in to the ER had a brief spell of left-sided chest discomfort which lasted for few minutes presently she is chest pain-free. Denies any shortness of breath nausea vomiting abdominal pain diarrhea or any headache or focal deficits.  Past Medical History  Diagnosis Date  . Diabetes mellitus   . Macular degeneration syndrome   . Dysrhythmia     Past Surgical History  Procedure Date  . Mastectomy   . Bowel resection   . Abdominal hysterectomy   . Ovarian cyst removal   . Tonsillectomy     History reviewed. No pertinent family history. Social History:  reports that she has never smoked. She does not have any smokeless tobacco history on file. She reports that she does not drink alcohol or use illicit drugs.  Allergies: No Known Allergies  Medications Prior to Admission  Medication Dose Route Frequency Provider Last Rate Last Dose  . diltiazem (CARDIZEM) 1 mg/mL load via infusion 10 mg  10 mg Intravenous Once Cyndra Numbers, MD   10 mg at 05/17/11 0209    . diltiazem (CARDIZEM) 100 mg in dextrose 5 % 100 mL infusion  5-15 mg/hr Intravenous To Major Cyndra Numbers, MD      . DISCONTD: diltiazem (CARDIZEM) injection SOLN 10 mg  10 mg Intravenous Once Cyndra Numbers, MD       No current outpatient prescriptions on file as of 05/16/2011.    Results for orders placed during the hospital encounter of 05/16/11 (from the past 48 hour(s))  CBC     Status: Abnormal   Collection Time   05/16/11  8:23 PM      Component Value Range Comment   WBC 8.4  4.0 - 10.5 (K/uL)    RBC 4.59  3.87 - 5.11 (MIL/uL)    Hemoglobin 12.2  12.0 - 15.0 (g/dL)    HCT 16.1  09.6 - 04.5 (%)    MCV 85.8  78.0 - 100.0 (fL)    MCH 26.6  26.0 - 34.0 (pg)    MCHC 31.0  30.0 - 36.0 (g/dL)    RDW 40.9 (*) 81.1 - 15.5 (%)    Platelets 225  150 - 400 (K/uL)   DIFFERENTIAL     Status: Normal   Collection Time   05/16/11  8:23 PM      Component Value Range Comment   Neutrophils Relative 71  43 - 77 (%)    Neutro Abs 5.9  1.7 - 7.7 (K/uL)    Lymphocytes Relative  19  12 - 46 (%)    Lymphs Abs 1.6  0.7 - 4.0 (K/uL)    Monocytes Relative 8  3 - 12 (%)    Monocytes Absolute 0.7  0.1 - 1.0 (K/uL)    Eosinophils Relative 1  0 - 5 (%)    Eosinophils Absolute 0.1  0.0 - 0.7 (K/uL)    Basophils Relative 0  0 - 1 (%)    Basophils Absolute 0.0  0.0 - 0.1 (K/uL)   COMPREHENSIVE METABOLIC PANEL     Status: Abnormal   Collection Time   05/16/11  8:23 PM      Component Value Range Comment   Sodium 140  135 - 145 (mEq/L)    Potassium 4.3  3.5 - 5.1 (mEq/L)    Chloride 104  96 - 112 (mEq/L)    CO2 28  19 - 32 (mEq/L)    Glucose, Bld 179 (*) 70 - 99 (mg/dL)    BUN 32 (*) 6 - 23 (mg/dL)    Creatinine, Ser 1.61 (*) 0.50 - 1.10 (mg/dL)    Calcium 9.6  8.4 - 10.5 (mg/dL)    Total Protein 6.8  6.0 - 8.3 (g/dL)    Albumin 3.2 (*) 3.5 - 5.2 (g/dL)    AST 20  0 - 37 (U/L)    ALT 16  0 - 35 (U/L)    Alkaline Phosphatase 65  39 - 117 (U/L)    Total Bilirubin 0.2 (*) 0.3 - 1.2 (mg/dL)    GFR calc non Af  Amer 42 (*) >90 (mL/min)    GFR calc Af Amer 48 (*) >90 (mL/min)   PROTIME-INR     Status: Abnormal   Collection Time   05/16/11  8:23 PM      Component Value Range Comment   Prothrombin Time 23.3 (*) 11.6 - 15.2 (seconds)    INR 2.03 (*) 0.00 - 1.49    GLUCOSE, CAPILLARY     Status: Abnormal   Collection Time   05/16/11 10:09 PM      Component Value Range Comment   Glucose-Capillary 122 (*) 70 - 99 (mg/dL)   URINALYSIS, ROUTINE W REFLEX MICROSCOPIC     Status: Abnormal   Collection Time   05/16/11 11:25 PM      Component Value Range Comment   Color, Urine YELLOW  YELLOW     APPearance CLEAR  CLEAR     Specific Gravity, Urine 1.014  1.005 - 1.030     pH 6.0  5.0 - 8.0     Glucose, UA 100 (*) NEGATIVE (mg/dL)    Hgb urine dipstick NEGATIVE  NEGATIVE     Bilirubin Urine NEGATIVE  NEGATIVE     Ketones, ur NEGATIVE  NEGATIVE (mg/dL)    Protein, ur NEGATIVE  NEGATIVE (mg/dL)    Urobilinogen, UA 0.2  0.0 - 1.0 (mg/dL)    Nitrite NEGATIVE  NEGATIVE     Leukocytes, UA MODERATE (*) NEGATIVE    URINE MICROSCOPIC-ADD ON     Status: Abnormal   Collection Time   05/16/11 11:25 PM      Component Value Range Comment   Squamous Epithelial / LPF MANY (*) RARE     WBC, UA 3-6  <3 (WBC/hpf)    RBC / HPF 0-2  <3 (RBC/hpf)    Urine-Other MUCOUS PRESENT     POCT I-STAT TROPONIN I     Status: Normal   Collection Time   05/17/11 12:22 AM  Component Value Range Comment   Troponin i, poc 0.01  0.00 - 0.08 (ng/mL)    Comment 3             Dg Chest 2 View  05/16/2011  *RADIOLOGY REPORT*  Clinical Data: Dizziness, hypertension  CHEST - 2 VIEW  Comparison:  04/17/2009  Findings: Stable mild cardiomegaly.  Lungs are clear.  No effusion. Atheromatous aortic arch.  Mild thoracolumbar spondylitic changes. Small hiatal hernia.  IMPRESSION: 1.  Stable mild cardiomegaly.  No acute disease.  Original Report Authenticated By: Osa Craver, M.D.    Review of Systems  Constitutional: Negative.   HENT:  Negative.   Eyes: Negative.   Respiratory: Negative.   Cardiovascular: Negative.   Gastrointestinal: Negative.   Genitourinary: Negative.   Musculoskeletal: Negative.   Skin: Negative.   Neurological: Positive for dizziness.  Psychiatric/Behavioral: Negative.     Blood pressure 141/81, pulse 102, temperature 97.7 F (36.5 C), temperature source Oral, resp. rate 20, height 5\' 3"  (1.6 m), weight 63.504 kg (140 lb), SpO2 97.00%. Physical Exam  Constitutional: She is oriented to person, place, and time. She appears well-developed and well-nourished. No distress.  HENT:  Head: Normocephalic and atraumatic.  Right Ear: External ear normal.  Left Ear: External ear normal.  Nose: Nose normal.  Mouth/Throat: Oropharynx is clear and moist. No oropharyngeal exudate.  Eyes: Conjunctivae are normal. Pupils are equal, round, and reactive to light. Right eye exhibits no discharge. Left eye exhibits no discharge.  Neck: Normal range of motion. Neck supple.  Cardiovascular:       Irregular rhythm.  Respiratory: Effort normal and breath sounds normal. No respiratory distress. She has no wheezes. She has no rales.  GI: Soft. Bowel sounds are normal. She exhibits no distension. There is no tenderness. There is no rebound.  Musculoskeletal: She exhibits edema.  Neurological: She is alert and oriented to person, place, and time.       Moves upper and lower extremities.  Skin: She is not diaphoretic.       Chronic skin changes in the lower extremities.  Psychiatric: Her behavior is normal.     Assessment/Plan #1. Near syncopal episodes probably from tachybradycardia syndrome - at this time a window was of any heart rate limiting medications. I have consulted cardiologist on call Dr Gwendalyn Ege who will be seeing patient in consult. He is advised to hold Coumadin and start heparin when INR is less than 2. And further recommendations cardiologist. #2. History of diabetes mellitus type 2 - patient will be  placed on CBG checks with sliding scale coverage and continue present medications. #3. History of hypertension and possible CHF - continue Diovan diuretics. Closely follow intake output and metabolic panel. #4. History of breast cancer status post mastectomy.  CODE STATUS - full code.  Chevon Fomby N. 05/17/2011, 3:03 AM

## 2011-05-17 NOTE — Progress Notes (Signed)
Utilization review complete 

## 2011-05-18 ENCOUNTER — Inpatient Hospital Stay (HOSPITAL_COMMUNITY): Payer: Medicare Other

## 2011-05-18 DIAGNOSIS — I495 Sick sinus syndrome: Principal | ICD-10-CM

## 2011-05-18 LAB — BASIC METABOLIC PANEL
CO2: 26 mEq/L (ref 19–32)
Calcium: 9.4 mg/dL (ref 8.4–10.5)
Creatinine, Ser: 1.15 mg/dL — ABNORMAL HIGH (ref 0.50–1.10)
Glucose, Bld: 94 mg/dL (ref 70–99)

## 2011-05-18 LAB — GLUCOSE, CAPILLARY
Glucose-Capillary: 94 mg/dL (ref 70–99)
Glucose-Capillary: 265 mg/dL — ABNORMAL HIGH (ref 70–99)
Glucose-Capillary: 133 mg/dL — ABNORMAL HIGH (ref 70–99)

## 2011-05-18 MED ORDER — WARFARIN SODIUM 3 MG PO TABS
3.0000 mg | ORAL_TABLET | Freq: Once | ORAL | Status: AC
  Administered 2011-05-18: 3 mg via ORAL
  Filled 2011-05-18: qty 1

## 2011-05-18 MED ORDER — METOPROLOL TARTRATE 12.5 MG HALF TABLET
12.5000 mg | ORAL_TABLET | Freq: Two times a day (BID) | ORAL | Status: DC
  Filled 2011-05-18 (×2): qty 1

## 2011-05-18 MED ORDER — METOPROLOL TARTRATE 25 MG PO TABS
25.0000 mg | ORAL_TABLET | Freq: Two times a day (BID) | ORAL | Status: DC
  Administered 2011-05-18 (×2): 25 mg via ORAL
  Filled 2011-05-18 (×3): qty 1

## 2011-05-18 MED ORDER — METOPROLOL TARTRATE 1 MG/ML IV SOLN
INTRAVENOUS | Status: AC
  Filled 2011-05-18: qty 5

## 2011-05-18 MED ORDER — METOPROLOL TARTRATE 1 MG/ML IV SOLN
5.0000 mg | Freq: Four times a day (QID) | INTRAVENOUS | Status: DC | PRN
  Administered 2011-05-18 – 2011-05-20 (×3): 5 mg via INTRAVENOUS
  Filled 2011-05-18 (×2): qty 5

## 2011-05-18 NOTE — ED Provider Notes (Signed)
Medical screening examination/treatment/procedure(s) were conducted as a shared visit with non-physician practitioner(s) and myself.  I personally evaluated the patient during the encounter  Cyndra Numbers, MD 05/18/11 [phone]

## 2011-05-18 NOTE — Progress Notes (Signed)
ANTICOAGULATION CONSULT NOTE - Follow Up Consult  Pharmacy Consult for Coumadin Indication: atrial fibrillation  No Known Allergies  Patient Measurements: Height: 5\' 3"  (160 cm) Weight: 145 lb 1 oz (65.8 kg) IBW/kg (Calculated) : 52.4   Vital Signs: Temp: 97.9 F (36.6 C) (02/07 0811) Temp src: Oral (02/07 0811) BP: 112/93 mmHg (02/07 0811) Pulse Rate: 70  (02/07 0811)  Labs:  Basename 05/18/11 0545 05/17/11 1941 05/17/11 1149 05/17/11 0913 05/17/11 0530 05/16/11 2023  HGB -- -- -- 11.8* 11.7* --  HCT -- -- -- 37.1 36.9 39.4  PLT -- -- -- 218 209 225  APTT -- -- -- -- -- --  LABPROT 24.7* -- -- -- 23.2* 23.3*  INR 2.19* -- -- -- 2.02* 2.03*  HEPARINUNFRC -- -- -- -- -- --  CREATININE 1.15* -- -- 1.06 1.10 --  CKTOTAL -- 58 63 -- 53 --  CKMB -- 3.0 3.4 -- 3.1 --  TROPONINI -- <0.30 <0.30 -- <0.30 --   Estimated Creatinine Clearance: 31.4 ml/min (by C-G formula based on Cr of 1.15).  Medical History: Past Medical History  Diagnosis Date  . Diabetes mellitus   . Macular degeneration syndrome   . Dysrhythmia     Medications:  Prescriptions prior to admission  Medication Sig Dispense Refill  . fish oil-omega-3 fatty acids 1000 MG capsule Take 2 g by mouth daily.      Marland Kitchen glimepiride (AMARYL) 4 MG tablet Take 4 mg by mouth daily before breakfast.      . metoprolol succinate (TOPROL-XL) 100 MG 24 hr tablet Take 100 mg by mouth daily. Take with or immediately following a meal.      . Multiple Vitamin (MULITIVITAMIN WITH MINERALS) TABS Take 1 tablet by mouth daily.      . Multiple Vitamins-Minerals (PRESERVISION AREDS 2 PO) Take 2 capsules by mouth daily.      . pantoprazole (PROTONIX) 40 MG tablet Take 40 mg by mouth daily.      . potassium chloride SA (K-DUR,KLOR-CON) 20 MEQ tablet Take 20 mEq by mouth daily.      . rosuvastatin (CRESTOR) 5 MG tablet Take 5 mg by mouth daily.      Marland Kitchen torsemide (DEMADEX) 100 MG tablet Take 100 mg by mouth daily.      .  valsartan-hydrochlorothiazide (DIOVAN-HCT) 320-12.5 MG per tablet Take 1 tablet by mouth daily.      Marland Kitchen warfarin (COUMADIN) 3 MG tablet Take 1.5-3 mg by mouth every evening. MWF-0.5 half tab (1.5 mg), All other days- 1 tab (3 mg)      Note took dose on 05/16/11 pm in the ED (used her home supply)  Scheduled:     . amiodarone  200 mg Oral Daily  .  ceFAZolin (ANCEF) IV  1 g Intravenous Q6H  . fentaNYL      . glimepiride  4 mg Oral QAC breakfast  . heparin      . lidocaine      . metoprolol      . metoprolol      . metoprolol tartrate  12.5 mg Oral BID  . midazolam      . midazolam      . olmesartan  40 mg Oral Daily  . omega-3 acid ethyl esters  1 g Oral Daily  . pantoprazole  40 mg Oral Q1200  . rosuvastatin  5 mg Oral q1800  . sodium chloride  3 mL Intravenous Q12H  . sodium chloride  3 mL Intravenous Q12H  .  DISCONTD:  ceFAZolin (ANCEF) IV  1 g Intravenous On Call  . DISCONTD: chlorhexidine  60 mL Topical Once  . DISCONTD: chlorhexidine  60 mL Topical Once  . DISCONTD: gentamicin irrigation  80 mg Irrigation On Call    Assessment:  78yo female presents with near-syncopal episodes thought to be due to tachybrady syndrome. Pharmacy consulted to start heparin when INR<2.  Warfarin PTA dose: warfarin (COUMADIN) 3 MG tablet Take 1.5-3 mg by mouth every evening. MWF-0.5 half tab (1.5 mg), All other days- 1 tab (3 mg)  Spoke with Dr. Johney Frame, pt now s/p pacer, likely going home this afternoon.  Will d/c heparin consult (never started) and resume home Coumadin dosing.  No bleeding or complications noted.  INR still therapeutic today at 2.19  Goal of Therapy:  INR 2-3   Plan:  1. Resume Coumadin with home dose of 3 mg tonight. 2. Daily PT/INR if patient does not d/c home today.   Kathreen Cornfield PharmD BCPS 05/18/2011,10:13 AM

## 2011-05-18 NOTE — Progress Notes (Signed)
[facility]  Subjective: Doing well this morning. She is out of bed to the chair and states she is hungry and is waiting on her breakfast tray to arrive. No complaints verbalized. She reports that when she had her spells prior to the pacemaker placed it made her very nervous. Later in the morning I was made aware the patient began having breakthrough tachycardia with ventricular rates 140s and 150s. Patient states she had ringing in her ears with this but had no awareness of tachypalpitations.  Objective: Vital signs in last 24 hours: Temp:  [97.3 F (36.3 C)-98 F (36.7 C)] 97.9 F (36.6 C) (02/07 0811) Pulse Rate:  [58-110] 70  (02/07 0811) Resp:  [14-19] 17  (02/07 0811) BP: (87-144)/(45-93) 132/77 mmHg (02/07 1000) SpO2:  [94 %-98 %] 97 % (02/07 1000) Weight:  [65.8 kg (145 lb 1 oz)] 65.8 kg (145 lb 1 oz) (02/07 0500) Weight change: 1.496 kg (3 lb 4.8 oz) Last BM Date: 05/16/11  Intake/Output from previous day: 02/06 0701 - 02/07 0700 In: 1160 [P.O.:960; IV Piggyback:200] Out: 2500 [Urine:2500] Intake/Output this shift: Total I/O In: 240 [P.O.:240] Out: 400 [Urine:400]  Physical exam: General appearance: alert, cooperative, appears stated age and no distress Resp: clear to auscultation bilaterally Cardio: regular rate and rhythm and systolic murmur: early systolic 1/6, soft at 2nd left intercostal space GI: soft, non-tender; bowel sounds normal; no masses,  no organomegaly Extremities: extremities normal, atraumatic, no cyanosis or edema Neurologic: Grossly normal  Lab Results:  Basename 05/17/11 0913 05/17/11 0530  WBC 7.8 8.5  HGB 11.8* 11.7*  HCT 37.1 36.9  PLT 218 209   BMET  Basename 05/18/11 0545 05/17/11 0913  NA 138 138  K 4.3 4.0  CL 103 102  CO2 26 26  GLUCOSE 94 149*  BUN 23 26*  CREATININE 1.15* 1.06  CALCIUM 9.4 9.6    Studies/Results: Dg Chest 2 View  05/18/2011  *RADIOLOGY REPORT*  Clinical Data: Weakness, post pacemaker placement   CHEST - 2 VIEW  Comparison: 05/16/2011  Findings: Cardiomediastinal silhouette is stable.  Dual lead cardiac pacemaker in place with leads in the right atrium and right ventricle.  There is no diagnostic pneumothorax.  No acute infiltrate or edema. Stable degenerative changes thoracic spine.  IMPRESSION: Dual lead cardiac pacemaker in place.  No diagnostic pneumothorax. No active disease.  Original Report Authenticated By: Natasha Mead, M.D.   Dg Chest 2 View  05/16/2011  *RADIOLOGY REPORT*  Clinical Data: Dizziness, hypertension  CHEST - 2 VIEW  Comparison:  04/17/2009  Findings: Stable mild cardiomegaly.  Lungs are clear.  No effusion. Atheromatous aortic arch.  Mild thoracolumbar spondylitic changes. Small hiatal hernia.  IMPRESSION: 1.  Stable mild cardiomegaly.  No acute disease.  Original Report Authenticated By: Osa Craver, M.D.    Medications:  I have reviewed the patient's current medications.  Assessment/Plan:   *Symptomatic bradycardia Appreciated electrophysiology cardiologist assistance. Patient is now status post permanent pacemaker and from that procedure has been approved for discharge for home.   Near syncope Resolved likely etiology from symptomatic bradycardia.   Tachy-brady syndrome/A-fib Has known chronic atrial fibrillation for many years and has been on chronic systemic Coumadin anticoagulation. Also has a history of near syncopal well several years ago which prompted her to move in with her son. In the past 24 hours has had more frequent spells and according to the H&P had 8 spells prior to arrival to the ER on 05/16/2011. Required beta blocker therapy  for rate control prior to admission. Post pacemaker insertion cardiology placed patient on amiodarone therapy 200 mg daily. Today she has developed symptomatic breakthrough tachycardia/atrial fibrillation with RVR. Possible she is experiencing reflexive tachycardia from beta blocker withdrawal. I have ordered when  necessary IV Lopressor to be given for heart rate greater than 110 and start a low-dose Lopressor 12.5 twice a day. The RN has also been asked to notify the cardiologist that the patient's heart rate is rapid in the event they wish to modify her medications further or pace her out of this rhythm.  Chronic systemic anticoagulation We will ask pharmacy to manage anticoagulation. Currently Coumadin is on hold in anticipation of likely pacemaker placement. Plan to initiate full dose IV heparin when INR status and 2.0. Current INR is 2.17.  Diabetes mellitus Continue glimepiride and sliding scale insulin with a.c. and hour sleep CBG checks. CBG's have been well controlled this admission.   HTN (hypertension) Blood pressure is well-controlled although decreases into the low 100s when either heart rate less than 60 or heart rate greater than 110. Preadmission diuretics have been stopped but she remains on her ARB agent. Lopressor started today-see above.  Dyslipidemia Continue omega-3 fatty acids as well as Crestor.   Disposition Remain in step down due to breakthrough tachycardia/atrial fibrillation with RVR.   LOS: 2 days   Junious Silk, ANP pager [phone]  [facility] 8 [email or web address] Password: [email or web address]  05/18/2011, 10:31 AM   I have examined the patient and reviewed the chart. I agree with the above note.   Calvert Cantor, MD [phone]

## 2011-05-18 NOTE — Op Note (Signed)
NAMEMYKAEL, Isabella Chen                ACCOUNT NO.:  [id number]  MEDICAL RECORD NO.:  [id number]  LOCATION:  2917                         FACILITY:  [facility]  PHYSICIAN:  Doylene Canning. Ladona Ridgel, MD    DATE OF BIRTH:  23-Jan-1922  DATE OF PROCEDURE:  05/17/2011 DATE OF DISCHARGE:                              OPERATIVE REPORT   PROCEDURE PERFORMED:  Insertion of dual-chamber pacemaker.  INDICATION:  Symptomatic brady-tachy syndrome with pauses of 6 seconds.  INTRODUCTION:  The patient is an 78 year old woman who was admitted to the hospital with recurrent episodes of near syncope.  At baseline, she has left bundle-branch block and she would go into atrial fibrillation with a rapid response and when she would terminate she would have pauses up to 6 seconds before going back into sinus rhythm.  She is now referred for dual-chamber pacemaker insertion.  PROCEDURE:  After informed consent was obtained, the patient was taken to the Diagnostic EP Lab in a fasting state.  After usual preparation draping, intravenous fentanyl and midazolam was given for sedation.  30 mL of lidocaine was infiltrated into the left infraclavicular region.  A 5-cm incision was carried out over this region and electrocautery was utilized to dissect down to the fascial plane.  Initial attempts to puncture the subclavian vein resulted in inability to advance the guidewire.  It was unclear whether she had an initial stenosis of the vein or whether there was spasm, but after a period of time the vein could not be punctured and a wire could not be advanced.  At this point, the cephalic vein was cut down and cannulated.  The Glidewire was advanced into the cephalic vein and after considerable difficulty the guidewire was able to traverse the subtotal occlusion and enter the central circulation in the right atrium.  A 9.5-French sheath was advanced over this guidewire and a St. Jude Tendril 2088T active fixation pacing lead, serial  number [id number] was advanced into the right ventricle and the St. Jude model 2088T 40-6 cm active fixation pacing lead, serial number [id number] was advanced into the right atrium. Mapping was then carried out in the right ventricle and at the final site the R-waves measured 10 mV, the pacing impedance was 600 ohms, and the threshold was a 1.1 V at 0.4 msec.  Ten-point pacing did not stimulate the diaphragm and there was a satisfactory injury current with active fixation of the lead.  With the ventricular lead in satisfactory position, attention was then turned to the atrial lead and was placed in the anterolateral portion of the right atrium.  Initially, fibrillation waves measured a millivolt, but after the patient returned to sinus rhythm P-waves were 2 mV.  The pacing impedance was 400 ohms and the threshold was 1.5 V at 0.4 msec.  Ten-volt pacing did not stimulate the diaphragm.  With these satisfactory parameters, the leads were secured to the subpectoral fascia with a figure-of-eight silk suture.  Sewing sleeve was secured with silk suture.  Electrocautery was utilized to make subcutaneous pocket.  Antibiotic irrigation was utilized to irrigate the pocket and electrocautery was utilized to assure hemostasis.  The St. Jude Accent DR dual-chamber pacemaker, serial  number [id number] was connected to the atrial and RV leads and placed back in the subcutaneous pocket.  The cannula was secured with silk suture. The pocket was irrigated with antibiotic irrigation and the incision was closed with 2-0 and 3-0 Vicryl.  Benzoin and Steri-Strips were painted on the skin, a pressure dressing was obtained, and the patient was returned to her room in satisfactory condition.  COMPLICATIONS:  There were no immediate procedure complications.  RESULTS:  This demonstrates successful implantation of a St. Jude dual- chamber pacemaker in a patient with symptomatic tachy-brady syndrome by way of the left  cephalic vein.     Doylene Canning. Ladona Ridgel, MD     GWT/MEDQ  D:  05/17/2011  T:  05/18/2011  Job:  [id number]

## 2011-05-18 NOTE — Progress Notes (Signed)
Inpatient Diabetes Program Recommendations  AACE/ADA: New Consensus Statement on Inpatient Glycemic Control (2009)  Target Ranges:  Prepandial:   less than 140 mg/dL      Peak postprandial:   less than 180 mg/dL (1-2 hours)      Critically ill patients:  140 - 180 mg/dL   Noon WUJ=[id number]   Inpatient Diabetes Program Recommendations Correction (SSI): start Novolog sensitive TID

## 2011-05-18 NOTE — Progress Notes (Signed)
SUBJECTIVE: The patient is doing well today s/p PPM and wants to go home.  At this time, she denies chest pain, shortness of breath, or any new concerns.     Marland Kitchen amiodarone  200 mg Oral Daily  .  ceFAZolin (ANCEF) IV  1 g Intravenous Q6H  . fentaNYL      . glimepiride  4 mg Oral QAC breakfast  . heparin      . lidocaine      . metoprolol      . midazolam      . midazolam      . olmesartan  40 mg Oral Daily  . omega-3 acid ethyl esters  1 g Oral Daily  . pantoprazole  40 mg Oral Q1200  . rosuvastatin  5 mg Oral q1800  . sodium chloride  3 mL Intravenous Q12H  . sodium chloride  3 mL Intravenous Q12H  . DISCONTD:  ceFAZolin (ANCEF) IV  1 g Intravenous On Call  . DISCONTD: chlorhexidine  60 mL Topical Once  . DISCONTD: chlorhexidine  60 mL Topical Once  . DISCONTD: gentamicin irrigation  80 mg Irrigation On Call  . DISCONTD: hydrochlorothiazide  12.5 mg Oral Daily  . DISCONTD: potassium chloride SA  20 mEq Oral Daily  . DISCONTD: torsemide  100 mg Oral Daily      . DISCONTD: sodium chloride    . DISCONTD: sodium chloride      OBJECTIVE: Physical Exam: Filed Vitals:   05/18/11 0300 05/18/11 0400 05/18/11 0500 05/18/11 0811  BP:  144/91  112/93  Pulse: 107 107  70  Temp:  97.8 F (36.6 C)  97.9 F (36.6 C)  TempSrc:  Oral  Oral  Resp:  19  17  Height:      Weight:   145 lb 1 oz (65.8 kg)   SpO2:  97%  96%    Intake/Output Summary (Last 24 hours) at 05/18/11 8119 Last data filed at 05/18/11 0600  Gross per 24 hour  Intake   1160 ml  Output   1950 ml  Net   -790 ml    Telemetry reveals sinus rhythm this am  GEN- The patient is well appearing, alert and oriented x 3 today.   Head- normocephalic, atraumatic Eyes-  Sclera clear, conjunctiva pink Ears- hearing intact Oropharynx- clear Neck- supple, no JVP Lymph- no cervical lymphadenopathy Lungs- Clear to ausculation bilaterally, normal work of breathing Heart- Regular rate and rhythm, no murmurs, rubs or  gallops, PMI not laterally displaced GI- soft, NT, ND, + BS Extremities- no clubbing, cyanosis, or edema Pacemaker pocket without hematoma  LABS: Basic Metabolic Panel:  Basename 05/18/11 0545 05/17/11 0913 05/17/11 0530  NA 138 138 --  K 4.3 4.0 --  CL 103 102 --  CO2 26 26 --  GLUCOSE 94 149* --  BUN 23 26* --  CREATININE 1.15* 1.06 --  CALCIUM 9.4 9.6 --  MG -- -- 2.1  PHOS -- -- --   Liver Function Tests:  Basename 05/17/11 0530 05/16/11 2023  AST 18 20  ALT 14 16  ALKPHOS 64 65  BILITOT 0.3 0.2*  PROT 6.7 6.8  ALBUMIN 3.0* 3.2*   No results found for this basename: LIPASE:2,AMYLASE:2 in the last 72 hours CBC:  Basename 05/17/11 0913 05/17/11 0530 05/16/11 2023  WBC 7.8 8.5 --  NEUTROABS -- -- 5.9  HGB 11.8* 11.7* --  HCT 37.1 36.9 --  MCV 84.9 85.0 --  PLT 218 209 --  Cardiac Enzymes:  Basename 05/17/11 1941 05/17/11 1149 05/17/11 0530  CKTOTAL 58 63 53  CKMB 3.0 3.4 3.1  CKMBINDEX -- -- --  TROPONINI <0.30 <0.30 <0.30   Thyroid Function Tests:  Basename 05/17/11 0530  TSH 1.224  T4TOTAL --  T3FREE --  THYROIDAB --   ASSESSMENT AND PLAN:  Principal Problem:  *Symptomatic bradycardia Active Problems:  Near syncope  A-fib  Diabetes mellitus  HTN (hypertension)  Tachy-brady syndrome  1.  Tachy/ brady- doing well s/p PPM Normal pacemaker function upon review of interrogation by me this am No changes today CXR reveals no ptx and stable leads Routine wound care  2. afib- stable Goal INR 2-3 Continue amiodarone 200mg  daily upon discharge  OK to dc to home from an EP standpoint later today She will need to follow-up in the [facility] for wound check in 10 days and then with Dr Ladona Ridgel in our [place] office in 3 months.  My office will arrange follow-up.     Hillis Range, MD 05/18/2011 8:23 AM

## 2011-05-19 LAB — GLUCOSE, CAPILLARY
Glucose-Capillary: 115 mg/dL — ABNORMAL HIGH (ref 70–99)
Glucose-Capillary: 128 mg/dL — ABNORMAL HIGH (ref 70–99)

## 2011-05-19 LAB — PROTIME-INR: Prothrombin Time: 21.7 seconds — ABNORMAL HIGH (ref 11.6–15.2)

## 2011-05-19 MED ORDER — METOPROLOL TARTRATE 50 MG PO TABS
50.0000 mg | ORAL_TABLET | Freq: Two times a day (BID) | ORAL | Status: AC
  Administered 2011-05-19 – 2011-05-20 (×4): 50 mg via ORAL
  Filled 2011-05-19 (×4): qty 1

## 2011-05-19 MED ORDER — WARFARIN SODIUM 3 MG PO TABS
3.0000 mg | ORAL_TABLET | Freq: Once | ORAL | Status: AC
  Administered 2011-05-19: 3 mg via ORAL
  Filled 2011-05-19: qty 1

## 2011-05-19 NOTE — Progress Notes (Signed)
Walked in this am with night nurse to meet the patient and pt heart rate was 130-140. Pt was stable no c/o of pain just states that she feels like her heart is beating fast. BP 139/59. 5mg  lopressor was given IV per order. Md notified and new orders given. By 900 patients heart rate had settled back down to NSR rate in the 60's. Will continue to monitor.   Breiona Couvillon, Charlaine Dalton RN

## 2011-05-19 NOTE — Progress Notes (Signed)
[facility]  Subjective: Up in chair eating breakfast. This morning she was actually aware of the tachypalpitations when they occurred. No other complaints verbalized.  Denies chest pain.  Objective: Vital signs in last 24 hours: Temp:  [97.5 F (36.4 C)-98.5 F (36.9 C)] 97.9 F (36.6 C) (02/08 1202) Pulse Rate:  [62-73] 62  (02/08 1202) Resp:  [18-21] 18  (02/08 1202) BP: (118-143)/(40-85) 120/66 mmHg (02/08 1202) SpO2:  [94 %-97 %] 94 % (02/08 1202) Weight change:  Last BM Date: 05/16/11  Intake/Output from previous day: 02/07 0701 - 02/08 0700 In: 1143 [P.O.:1040; I.V.:3; IV Piggyback:100] Out: 2400 [Urine:2400] Intake/Output this shift: Total I/O In: -  Out: 250 [Urine:250]  Physical exam: General appearance: alert, cooperative, appears stated age and no distress Resp: clear to auscultation bilaterally Cardio: regular rate and rhythm and systolic murmur: early systolic 1/6, soft at 2nd left intercostal space GI: soft, non-tender; bowel sounds normal; no masses,  no organomegaly Extremities: extremities normal, atraumatic, no cyanosis or edema Neurologic: Grossly normal  Lab Results:  Basename 05/17/11 0913 05/17/11 0530  WBC 7.8 8.5  HGB 11.8* 11.7*  HCT 37.1 36.9  PLT 218 209   BMET  Basename 05/18/11 0545 05/17/11 0913  NA 138 138  K 4.3 4.0  CL 103 102  CO2 26 26  GLUCOSE 94 149*  BUN 23 26*  CREATININE 1.15* 1.06  CALCIUM 9.4 9.6   Medications:  I have reviewed the patient's current medications.  Assessment/Plan:  Symptomatic bradycardia Appreciated electrophysiology cardiologist assistance. Patient is now status post permanent pacemaker and from that procedure has been approved for discharge for home once ventricular rates are controlled. See below  Near syncope Resolved - likely etiology from symptomatic bradycardia.  Tachy-brady syndrome/A-fib Has known chronic atrial fibrillation for many years and has been on chronic systemic  Coumadin anticoagulation. Also has a history of near syncopal spell several years ago which prompted her to move in with her son. In the past 24 hours has had more frequent spells and according to the H&P had 8 spells prior to arrival to the ER on 05/16/2011. Required beta blocker therapy for rate control prior to admission. Post pacemaker insertion cardiology placed patient on amiodarone therapy 200 mg daily. Less bursts of SVT since the initiation of a beta blocker. Cardiology has increased Lopressor dose today. Consideration has been given to increasing the amiodarone dose as well.  Chronic systemic anticoagulation pharmacy to manage anticoagulation.   Diabetes mellitus Continue glimepiride and sliding scale insulin with a.c. and hour sleep CBG checks. CBG's have been well controlled this admission.   HTN (hypertension) Blood pressure currently well controlled on combination of Lopressor 50 mg twice a day and the olmesartin 40 mg daily. Please remember patient was on Diovan prior to admission and the olmesartin substituted by pharmacy  Dyslipidemia Continue omega-3 fatty acids as well as Crestor.  Disposition Remain in step down due to breakthrough tachycardia/atrial fibrillation with RVR.   LOS: 3 days   Junious Silk, ANP pager [phone]  [facility] 8 [email or web address] Password: [email or web address]  05/19/2011, 12:06 PM  I have personally examined this patient and reviewed the entire database. I have reviewed the above note, made any necessary editorial changes, and agree with its content.  Lonia Blood, MD [facility]

## 2011-05-19 NOTE — ED Provider Notes (Signed)
Medical screening examination/treatment/procedure(s) were performed by non-physician practitioner and as supervising physician I was immediately available for consultation/collaboration.  Loren Racer, MD 05/19/11 1146

## 2011-05-19 NOTE — Progress Notes (Signed)
ANTICOAGULATION CONSULT NOTE - Follow Up Consult  Pharmacy Consult for Coumadin Indication: atrial fibrillation  No Known Allergies  Patient Measurements: Height: 5\' 3"  (160 cm) Weight: 145 lb 1 oz (65.8 kg) IBW/kg (Calculated) : 52.4   Vital Signs: Temp: 98.5 F (36.9 C) (02/08 0831) Temp src: Oral (02/08 0831) BP: 139/59 mmHg (02/08 0800) Pulse Rate: 65  (02/08 0831)  Labs:  Isabella Chen 05/19/11 0535 05/18/11 0545 05/17/11 1941 05/17/11 1149 05/17/11 0913 05/17/11 0530 05/16/11 2023  HGB -- -- -- -- 11.8* 11.7* --  HCT -- -- -- -- 37.1 36.9 39.4  PLT -- -- -- -- 218 209 225  APTT -- -- -- -- -- -- --  LABPROT 21.7* 24.7* -- -- -- 23.2* --  INR 1.85* 2.19* -- -- -- 2.02* --  HEPARINUNFRC -- -- -- -- -- -- --  CREATININE -- 1.15* -- -- 1.06 1.10 --  CKTOTAL -- -- 58 63 -- 53 --  CKMB -- -- 3.0 3.4 -- 3.1 --  TROPONINI -- -- <0.30 <0.30 -- <0.30 --   Estimated Creatinine Clearance: 31.4 ml/min (by C-G formula based on Cr of 1.15).  Scheduled:     . amiodarone  200 mg Oral Daily  .  ceFAZolin (ANCEF) IV  1 g Intravenous Q6H  . glimepiride  4 mg Oral QAC breakfast  . metoprolol tartrate  50 mg Oral BID  . olmesartan  40 mg Oral Daily  . omega-3 acid ethyl esters  1 g Oral Daily  . pantoprazole  40 mg Oral Q1200  . rosuvastatin  5 mg Oral q1800  . sodium chloride  3 mL Intravenous Q12H  . sodium chloride  3 mL Intravenous Q12H  . warfarin  3 mg Oral ONCE-1800  . DISCONTD: metoprolol tartrate  12.5 mg Oral BID  . DISCONTD: metoprolol tartrate  25 mg Oral BID    Assessment:  78yo female presents with near-syncopal episodes thought to be due to tachybrady syndrome. Pharmacy consulted to continue coumadin Warfarin PTA dose: STTS 3mg , MWF 1.5mg . INR down to 1.85 today. Yesterday patient  developed symptomatic breakthrough tachycardia/atrial fibrillation with RVR now on lopressor.   Goal of Therapy:  INR 2-3   Plan:  -Coumadin 3 mg tonight.   Isabella Chen  PharmD BCPS 05/19/2011,8:39 AM

## 2011-05-20 LAB — GLUCOSE, CAPILLARY
Glucose-Capillary: 174 mg/dL — ABNORMAL HIGH (ref 70–99)
Glucose-Capillary: 141 mg/dL — ABNORMAL HIGH (ref 70–99)

## 2011-05-20 MED ORDER — METOPROLOL SUCCINATE ER 100 MG PO TB24
100.0000 mg | ORAL_TABLET | Freq: Every day | ORAL | Status: DC
  Administered 2011-05-21: 100 mg via ORAL
  Filled 2011-05-20: qty 1

## 2011-05-20 MED ORDER — SODIUM CHLORIDE 0.9 % IV SOLN
INTRAVENOUS | Status: DC
  Administered 2011-05-20: 10 mL via INTRAVENOUS

## 2011-05-20 MED ORDER — AMIODARONE HCL IN DEXTROSE 360-4.14 MG/200ML-% IV SOLN
1.0000 mg/min | INTRAVENOUS | Status: AC
  Administered 2011-05-20 (×2): 1 mg/min via INTRAVENOUS
  Filled 2011-05-20 (×3): qty 200

## 2011-05-20 MED ORDER — WARFARIN SODIUM 5 MG PO TABS
5.0000 mg | ORAL_TABLET | Freq: Once | ORAL | Status: AC
  Administered 2011-05-20: 5 mg via ORAL
  Filled 2011-05-20: qty 1

## 2011-05-20 MED ORDER — AMIODARONE LOAD VIA INFUSION
150.0000 mg | Freq: Once | INTRAVENOUS | Status: AC
  Administered 2011-05-20: 150 mg via INTRAVENOUS
  Filled 2011-05-20: qty 83.34

## 2011-05-20 MED ORDER — AMIODARONE HCL 200 MG PO TABS
200.0000 mg | ORAL_TABLET | Freq: Two times a day (BID) | ORAL | Status: DC
  Administered 2011-05-20 – 2011-05-21 (×2): 200 mg via ORAL
  Filled 2011-05-20 (×3): qty 1

## 2011-05-20 MED ORDER — AMIODARONE HCL IN DEXTROSE 360-4.14 MG/200ML-% IV SOLN
0.5000 mg/min | INTRAVENOUS | Status: DC
  Filled 2011-05-20 (×4): qty 200

## 2011-05-20 MED ORDER — PROPOFOL 10 MG/ML IV EMUL
INTRAVENOUS | Status: AC
  Filled 2011-05-20: qty 100

## 2011-05-20 NOTE — Evaluation (Signed)
Occupational Therapy Evaluation Patient Details Name: Isabella Chen MRN: [id number] DOB: 1922-03-03 Today's Date: 05/20/2011  Problem List:  Patient Active Problem List  Diagnoses  . Near syncope  . A-fib  . Diabetes mellitus  . HTN (hypertension)  . Tachy-brady syndrome  . Symptomatic bradycardia    Past Medical History:  Past Medical History  Diagnosis Date  . Diabetes mellitus   . Macular degeneration syndrome   . Dysrhythmia    Past Surgical History:  Past Surgical History  Procedure Date  . Mastectomy   . Bowel resection   . Abdominal hysterectomy   . Ovarian cyst removal   . Tonsillectomy     OT Assessment/Plan/Recommendation OT Assessment Clinical Impression Statement: Pt is an 78 year old woman admitted with bradycardia.  Prior to admission she was modified independent in mobility and ADL and lived with her son and daughter in law.  Pt now requires min assist over all.  Will follow pt acutely.  Recommend home with 24 hour care initially and HHOT.  No equipment needs post-hospitalization. OT Recommendation/Assessment: Patient will need skilled OT in the acute care venue OT Problem List: Decreased range of motion;Decreased activity tolerance;Decreased knowledge of precautions;Cardiopulmonary status limiting activity;Impaired UE functional use OT Therapy Diagnosis : Generalized weakness OT Plan OT Frequency: Min 2X/week OT Treatment/Interventions: Self-care/ADL training;Patient/family education;Energy conservation OT Recommendation Follow Up Recommendations: Home health OT;Supervision/Assistance - 24 hour Equipment Recommended: None recommended by OT Individuals Consulted Consulted and Agree with Results and Recommendations: Patient OT Goals Acute Rehab OT Goals OT Goal Formulation: With patient Time For Goal Achievement: 2 weeks ADL Goals Pt Will Perform Grooming: with supervision;Standing at sink ADL Goal: Grooming - Progress: Goal set today Pt Will Perform  Upper Body Bathing: with supervision;Sitting at sink ADL Goal: Upper Body Bathing - Progress: Goal set today Pt Will Perform Lower Body Bathing: Sitting, chair;Standing at sink;with supervision ADL Goal: Lower Body Bathing - Progress: Goal set today Pt Will Perform Upper Body Dressing: with set-up;Sitting, chair ADL Goal: Upper Body Dressing - Progress: Goal set today Pt Will Perform Lower Body Dressing: Sitting, bed;Sit to stand from bed;with supervision ADL Goal: Lower Body Dressing - Progress: Goal set today Pt Will Transfer to Toilet: with supervision;3-in-1;Other (comment);Ambulation (over toilet) ADL Goal: Toilet Transfer - Progress: Goal set today Pt Will Perform Toileting - Clothing Manipulation: Standing;with supervision ADL Goal: Toileting - Clothing Manipulation - Progress: Goal set today Pt Will Perform Toileting - Hygiene: Independently;Sitting on 3-in-1 or toilet ADL Goal: Toileting - Hygiene - Progress: Goal set today Miscellaneous OT Goals Miscellaneous OT Goal #1: Pt will generalize energy conservation techniques in ADL. OT Goal: Miscellaneous Goal #1 - Progress: Goal set today  OT Evaluation Precautions/Restrictions  Precautions Precautions: Fall;ICD/Pacemaker Restrictions Weight Bearing Restrictions: No Prior Functioning Home Living Lives With: Son;Other (Comment) (daughter in Social worker) Receives Help From: Family Type of Home: House Home Layout: One level Home Access: Stairs to enter Entrance Stairs-Rails: Doctor, general practice of Steps: 4 Bathroom Shower/Tub: Engineer, manufacturing systems: Standard Bathroom Accessibility: Yes How Accessible: Accessible via walker Home Adaptive Equipment: Bedside commode/3-in-1;Walker - rolling;Walker - standard;Straight cane Prior Function Level of Independence: Independent with basic ADLs;Independent with gait;Independent with transfers Able to Take Stairs?: Yes Driving: No Vocation:  Retired ADL ADL Eating/Feeding: Simulated;Set up;Other (comment) (difficulty opening containers due to arthritis) Where Assessed - Eating/Feeding: Chair Grooming: Performed;Wash/dry face;Brushing hair Where Assessed - Grooming: Sitting, chair Upper Body Bathing: Minimal assistance;Simulated Where Assessed - Upper Body Bathing: Sitting, chair Lower Body  Bathing: Simulated;Minimal assistance Where Assessed - Lower Body Bathing: Sit to stand from chair;Sitting, chair Upper Body Dressing: Simulated;Minimal assistance Where Assessed - Upper Body Dressing: Sitting, chair Lower Body Dressing: Performed;Minimal assistance Where Assessed - Lower Body Dressing: Sitting, chair;Sit to stand from chair Toilet Transfer: Performed;Minimal assistance Toilet Transfer Method: Proofreader: Set designer - Hygiene: Performed;Supervision/safety Where Assessed - Toileting Hygiene: Sit on 3-in-1 or toilet Tub/Shower Transfer: Other (comment) (pt does not shower at home, sponge bathes) Equipment Used: Rolling walker Ambulation Related to ADLs: Min assist to ambulate with RW. Vision/Perception  Vision - History Patient Visual Report: No change from baseline Cognition Cognition Arousal/Alertness: Awake/alert Overall Cognitive Status: Appears within functional limits for tasks assessed Orientation Level: Oriented X4 Extremity Assessment RUE Assessment RUE Assessment: Exceptions to [facility] (pt with baseline arthritis limiting ROM) LUE Assessment LUE Assessment: Exceptions to [facility] (Pt with baseline arthritis limiting shoulder ROM, s/p pacema) Mobility  Bed Mobility Bed Mobility: No Transfers Transfers: Yes Sit to Stand: 4: Min assist;From chair/3-in-1;With armrests Stand to Sit: 4: Min assist;To chair/3-in-1 End of Session OT - End of Session Equipment Utilized During Treatment: Gait belt Activity Tolerance: Patient tolerated treatment well Patient left: in  chair;with call bell in reach;with family/visitor present General Behavior During Session: [facility] for tasks performed Cognition: [facility] for tasks performed   Evern Bio 05/20/2011, 4:33 PM [phone]

## 2011-05-20 NOTE — Progress Notes (Signed)
[facility]  Subjective: She continues to report awareness of tachy-palpitaions.  She denies other complaints.  No chest pain, n/v, abdom pain, f/c, or HA.  She lives with her son and his wife.    Objective: Blood pressure 118/59, pulse 62, temperature 98.5 F (36.9 C), temperature source Oral, resp. rate 20, height 5\' 3"  (1.6 m), weight 66.679 kg (147 lb), SpO2 98.00%.  Physical exam: General appearance: alert, cooperative, appears stated age and no distress Resp: clear to auscultation bilaterally Cardio: irreg irreg w/ rate of 140 at the time of my exam - no new M  GI: soft, non-tender; bowel sounds normal; no masses,  no organomegaly Extremities: extremities normal, atraumatic, no cyanosis or edema Neurologic: Grossly normal  Lab Results:  Basename 05/17/11 0913  WBC 7.8  HGB 11.8*  HCT 37.1  PLT 218   BMET  Basename 05/18/11 0545 05/17/11 0913  NA 138 138  K 4.3 4.0  CL 103 102  CO2 26 26  GLUCOSE 94 149*  BUN 23 26*  CREATININE 1.15* 1.06  CALCIUM 9.4 9.6   Medications:  I have reviewed the patient's current medications.  Assessment/Plan:  Symptomatic bradycardia Patient is now status post permanent pacemaker and from that procedure has been approved for discharge for home once ventricular rates are controlled. EP has signed off.   Near syncope Resolved - likely etiology from symptomatic bradycardia due to post-termination pauses.  Tachy-brady syndrome/A-fib Though brady is now corrected with  Pacer, her afib remains uncontrolled, with tachycardia into 140s frequently over the last 48hrs - EP has signed off saying she is clear for D/C home - I have discussed her care with a Warden/ranger - I will increase her oral amio to BID, and also expedite her amio load by infusing one bottle of IV amio today - cont to follow on SDU until her rapid rate is controlled  Chronic systemic anticoagulation pharmacy to manage anticoagulation - INR is not at goal at  present   Diabetes mellitus Continue glimepiride and sliding scale insulin with SSI - no change in tx plan today   HTN (hypertension) Blood pressure currently well controlled on combination of Lopressor 50 mg twice a day and the olmesartin 40 mg daily.   Dyslipidemia Continue omega-3 fatty acids as well as Crestor.  Disposition Remain in step down due to breakthrough tachycardia/atrial fibrillation with RVR   LOS: 4 days  05/20/2011, 9:06 AM  Lonia Blood, MD [facility] Office  [phone] Pager [phone]  On-Call/Text Page:      [email or web address]      password [facility]

## 2011-05-20 NOTE — Progress Notes (Signed)
ANTICOAGULATION CONSULT NOTE - Follow Up Consult  Pharmacy Consult for Coumadin Indication: atrial fibrillation  No Known Allergies  Patient Measurements: Height: 5\' 3"  (160 cm) Weight: 147 lb (66.679 kg) IBW/kg (Calculated) : 52.4   Vital Signs: Temp: 98.5 F (36.9 C) (02/09 0750) Temp src: Oral (02/09 0750) BP: 118/59 mmHg (02/09 0750) Pulse Rate: 62  (02/08 2144)  Labs:  Basename 05/20/11 0530 05/19/11 0535 05/18/11 0545 05/17/11 1941 05/17/11 1149 05/17/11 0913  HGB -- -- -- -- -- 11.8*  HCT -- -- -- -- -- 37.1  PLT -- -- -- -- -- 218  APTT -- -- -- -- -- --  LABPROT 18.7* 21.7* 24.7* -- -- --  INR 1.53* 1.85* 2.19* -- -- --  HEPARINUNFRC -- -- -- -- -- --  CREATININE -- -- 1.15* -- -- 1.06  CKTOTAL -- -- -- 58 63 --  CKMB -- -- -- 3.0 3.4 --  TROPONINI -- -- -- <0.30 <0.30 --   Estimated Creatinine Clearance: 31.6 ml/min (by C-G formula based on Cr of 1.15).  Scheduled:     . amiodarone  200 mg Oral Daily  . glimepiride  4 mg Oral QAC breakfast  . metoprolol tartrate  50 mg Oral BID  . olmesartan  40 mg Oral Daily  . omega-3 acid ethyl esters  1 g Oral Daily  . pantoprazole  40 mg Oral Q1200  . rosuvastatin  5 mg Oral q1800  . sodium chloride  3 mL Intravenous Q12H  . sodium chloride  3 mL Intravenous Q12H  . warfarin  3 mg Oral ONCE-1800    Assessment:  78yo female presents with near-syncopal episodes thought to be due to tachybrady syndrome. Pharmacy consulted to continue coumadin Warfarin PTA dose: STTS 3mg , MWF 1.5mg . INR down to 1.53 today. Will increase dose   Goal of Therapy:  INR 2-3   Plan:  -Coumadin 5 mg po tonight x1.   Janace Litten PharmD 05/20/2011,8:51 AM

## 2011-05-20 NOTE — Evaluation (Signed)
Physical Therapy Evaluation Patient Details Name: Isabella Chen MRN: [id number] DOB: Sep 05, 1921 Today's Date: 05/20/2011  Problem List:  Patient Active Problem List  Diagnoses  . Near syncope  . A-fib  . Diabetes mellitus  . HTN (hypertension)  . Tachy-brady syndrome  . Symptomatic bradycardia    Past Medical History:  Past Medical History  Diagnosis Date  . Diabetes mellitus   . Macular degeneration syndrome   . Dysrhythmia    Past Surgical History:  Past Surgical History  Procedure Date  . Mastectomy   . Bowel resection   . Abdominal hysterectomy   . Ovarian cyst removal   . Tonsillectomy     PT Assessment/Plan/Recommendation PT Assessment Clinical Impression Statement: Pt is 78 y/o female admitted for bradycardia and near syncope episode with s/p pacemaker insertion.  Pt continues to have varying ranges of HR and noticeable decrease in HR with exercise.  RN and MD aware and wants pt mobilize to determine heart rate response.  HR ranges from 80s to 130s at rest (unsure if monitor is a good read).  Pt will benefit from acute PT services to improve overall mobility, balance and maximize independence prior to d/c home with son.   PT Recommendation/Assessment: Patient will need skilled PT in the acute care venue PT Problem List: Decreased activity tolerance;Decreased balance;Decreased mobility;Decreased knowledge of use of DME PT Therapy Diagnosis : Difficulty walking;Abnormality of gait;Generalized weakness PT Plan PT Frequency: Min 3X/week PT Treatment/Interventions: DME instruction;Gait training;Stair training;Functional mobility training;Therapeutic activities;Therapeutic exercise;Balance training;Patient/family education PT Recommendation Follow Up Recommendations: Home health PT;Supervision/Assistance - 24 hour Equipment Recommended: Rolling walker with 5" wheels PT Goals  Acute Rehab PT Goals PT Goal Formulation: With patient Time For Goal Achievement: 7 days Pt  will go Supine/Side to Sit: with modified independence PT Goal: Supine/Side to Sit - Progress: Goal set today Pt will go Sit to Supine/Side: with modified independence PT Goal: Sit to Supine/Side - Progress: Goal set today Pt will go Sit to Stand: with modified independence PT Goal: Sit to Stand - Progress: Goal set today Pt will go Stand to Sit: with modified independence PT Goal: Stand to Sit - Progress: Goal set today Pt will Ambulate: >150 feet;with modified independence;with rolling walker PT Goal: Ambulate - Progress: Goal set today Pt will Go Up / Down Stairs: 3-5 stairs;with supervision;with least restrictive assistive device PT Goal: Up/Down Stairs - Progress: Goal set today  PT Evaluation Precautions/Restrictions  Precautions Precautions: Fall Restrictions Weight Bearing Restrictions: No Prior Functioning  Home Living Lives With: Son (daughter-in-law) Receives Help From: Family Type of Home: House Home Layout: One level Home Access: Stairs to enter Entrance Stairs-Rails: Doctor, general practice of Steps: 4 Bathroom Shower/Tub: Tub/shower unit (takes sponge baths) Bathroom Toilet: Standard Bathroom Accessibility: Yes How Accessible: Accessible via walker Home Adaptive Equipment: Bedside commode/3-in-1;Walker - rolling;Walker - standard;Straight cane Prior Function Level of Independence: Independent with basic ADLs;Independent with gait;Independent with transfers Able to Take Stairs?: Yes Driving: No Vocation: Retired Producer, television/film/video: Awake/alert Overall Cognitive Status: Appears within functional limits for tasks assessed Orientation Level: Oriented X4 Sensation/Coordination   Extremity Assessment RUE Assessment RUE Assessment: Within Functional Limits LUE Assessment LUE Assessment: Not tested RLE Assessment RLE Assessment: Within Functional Limits LLE Assessment LLE Assessment: Within Functional Limits Mobility (including  Balance) Bed Mobility Bed Mobility: No Transfers Transfers: Yes Sit to Stand: 4: Min assist;From chair/3-in-1;With armrests Sit to Stand Details (indicate cue type and reason): (A) to initiate transfer using only right hand due to  left side with recent pacemaker insertion.   Stand to Sit: 4: Min assist;To chair/3-in-1 Stand to Sit Details: (A) to slowly descend to recliner with cues for hand placement. Ambulation/Gait Ambulation/Gait: Yes Ambulation/Gait Assistance: 4: Min assist Ambulation/Gait Assistance Details (indicate cue type and reason): (A) to maitain balance with cues for RW placement.  Pt tends to sway laterally during ambulation. Ambulation Distance (Feet): 80 Feet Assistive device: Rolling walker Gait Pattern: Step-to pattern;Decreased stance time - right;Decreased stance time - left;Shuffle Stairs: No  Posture/Postural Control Posture/Postural Control: Postural limitations Postural Limitations: mild kyphotic Balance Balance Assessed: Yes Static Standing Balance Static Standing - Balance Support: Bilateral upper extremity supported Static Standing - Level of Assistance: 4: Min assist Static Standing - Comment/# of Minutes: Pt stood ~2 minutes prior to ambulation with bilateral UE support and min (A) initially to maintain midline. Exercise    End of Session PT - End of Session Equipment Utilized During Treatment: Gait belt Activity Tolerance: Patient tolerated treatment well Patient left: in chair;with call bell in reach Nurse Communication: Mobility status for transfers;Mobility status for ambulation General Behavior During Session: [facility] for tasks performed Cognition: [facility] for tasks performed  Duayne Brideau 05/20/2011, 10:16 AM [phone]

## 2011-05-21 ENCOUNTER — Encounter (HOSPITAL_COMMUNITY): Payer: Self-pay | Admitting: Emergency Medicine

## 2011-05-21 LAB — CBC
MCV: 84.8 fL (ref 78.0–100.0)
Platelets: 183 10*3/uL (ref 150–400)
RDW: 16.3 % — ABNORMAL HIGH (ref 11.5–15.5)
WBC: 9.1 10*3/uL (ref 4.0–10.5)

## 2011-05-21 LAB — BASIC METABOLIC PANEL
Chloride: 100 mEq/L (ref 96–112)
Creatinine, Ser: 1.55 mg/dL — ABNORMAL HIGH (ref 0.50–1.10)
GFR calc Af Amer: 34 mL/min — ABNORMAL LOW (ref >90)
Potassium: 4 mEq/L (ref 3.5–5.1)

## 2011-05-21 LAB — PROTIME-INR
INR: 1.72 — ABNORMAL HIGH (ref 0.00–1.49)
Prothrombin Time: 20.5 seconds — ABNORMAL HIGH (ref 11.6–15.2)

## 2011-05-21 LAB — MAGNESIUM: Magnesium: 2.1 mg/dL (ref 1.5–2.5)

## 2011-05-21 MED ORDER — AMIODARONE HCL 200 MG PO TABS: 200.0000 mg | ORAL_TABLET | Freq: Every day | ORAL | Status: DC

## 2011-05-21 MED ORDER — WARFARIN SODIUM 5 MG PO TABS
5.0000 mg | ORAL_TABLET | Freq: Once | ORAL | Status: DC
  Filled 2011-05-21: qty 1

## 2011-05-21 MED ORDER — AMIODARONE HCL 200 MG PO TABS: 200.0000 mg | ORAL_TABLET | Freq: Two times a day (BID) | ORAL | Status: DC

## 2011-05-21 NOTE — Progress Notes (Signed)
CBG: 63  Treatment: 15 GM carbohydrate snack  Symptoms: None  Follow-up CBG: Time:0810 CBG Result:77  Possible Reasons for Event: Unknown  Comments/MD notified:mcclung     Tammy Sours

## 2011-05-21 NOTE — Progress Notes (Signed)
ANTICOAGULATION CONSULT NOTE - Follow Up Consult  Pharmacy Consult for Coumadin Indication: atrial fibrillation   Assessment:  78yo female presents with near-syncopal episodes thought to be due to tachybrady syndrome. Pharmacy consulted to continue coumadin Warfarin PTA dose: STTS 3mg , MWF 1.5mg . INR still subtherapeutic but increasing towards goal (1.72 today). Pt on amio, will monitor closely. No bleeding noted   Goal of Therapy:  INR 2-3   Plan:  -Coumadin 5 mg po tonight x1. - F/U INR in AM  No Known Allergies  Patient Measurements: Height: 5\' 3"  (160 cm) Weight: 148 lb 2.4 oz (67.2 kg) IBW/kg (Calculated) : 52.4   Vital Signs: Temp: 97.7 F (36.5 C) (02/10 0830) Temp src: Oral (02/10 0830) BP: 147/67 mmHg (02/10 0830) Pulse Rate: 60  (02/10 0000)  Labs:  Basename 05/21/11 0516 05/20/11 0530 05/19/11 0535  HGB 11.2* -- --  HCT 35.2* -- --  PLT 183 -- --  APTT -- -- --  LABPROT 20.5* 18.7* 21.7*  INR 1.72* 1.53* 1.85*  HEPARINUNFRC -- -- --  CREATININE 1.55* -- --  CKTOTAL -- -- --  CKMB -- -- --  TROPONINI -- -- --   Estimated Creatinine Clearance: 23.5 ml/min (by C-G formula based on Cr of 1.55).  Scheduled:     . amiodarone  150 mg Intravenous Once  . amiodarone  200 mg Oral BID  . glimepiride  4 mg Oral QAC breakfast  . metoprolol tartrate  50 mg Oral BID  . metoprolol succinate  100 mg Oral Daily  . olmesartan  40 mg Oral Daily  . omega-3 acid ethyl esters  1 g Oral Daily  . pantoprazole  40 mg Oral Q1200  . rosuvastatin  5 mg Oral q1800  . sodium chloride  3 mL Intravenous Q12H  . sodium chloride  3 mL Intravenous Q12H  . warfarin  5 mg Oral ONCE-1800  . warfarin  5 mg Oral ONCE-1800     Janace Litten PharmD [phone] 05/21/2011,9:15 AM

## 2011-05-21 NOTE — Progress Notes (Signed)
   CARE MANAGEMENT NOTE 05/21/2011  Patient:  Isabella Chen, Isabella Chen   Account Number:  [id number]  Date Initiated:  05/21/2011  Documentation initiated by:  [facility]  Subjective/Objective Assessment:   afib, HTN     Action/Plan:   Anticipated DC Date:  05/21/2011   Anticipated DC Plan:  HOME W HOME HEALTH SERVICES      DC Planning Services  CM consult      [facility] Choice  HOME HEALTH   Choice offered to / List presented to:  C-1 Patient        HH arranged  HH-3 OT  HH-2 PT      HH agency  [facility]   Status of service:  Completed, signed off Medicare Important Message given?   (If response is "NO", the following Medicare IM given date fields will be blank) Date Medicare IM given:   Date Additional Medicare IM given:    Discharge Disposition:  HOME W HOME HEALTH SERVICES  Per UR Regulation:    Comments:  05/21/2011 1245 Spoke to pt and states she had HH care with [facility] in the past. Wanted to stay with agency. Contacted [facility] for [facility] PT/OT for scheduled d/c today. Has RW at home. Isidoro Donning RN CCM Case Mgmt phone [phone]

## 2011-05-21 NOTE — Progress Notes (Signed)
Pt discharged home with son.Pt and son reviewed dc instructions with RN and answered all questions.Pt took clothes/glasses with home with them.  Tammy Sours

## 2011-05-21 NOTE — Discharge Summary (Signed)
DISCHARGE SUMMARY  Isabella Chen  MR#: [id number]  DOB:03-22-1922  Date of Admission: 05/16/2011 Date of Discharge: 05/21/2011  Attending Physician:MCCLUNG,JEFFREY T  Patient's PCP:  Dr. Juleen China  Consults:  [facility]/EP - Dr. Sharrell Ku  Discharge Diagnoses: Present on Admission:   .Near syncope .A-fib .Diabetes mellitus .HTN (hypertension) .Tachy-brady syndrome .Symptomatic bradycardia  Medication List  As of 05/21/2011 12:57 PM   STOP taking these medications         potassium chloride SA 20 MEQ tablet      torsemide 100 MG tablet         TAKE these medications         amiodarone 200 MG tablet   Commonly known as: PACERONE   Take 1 tablet (200 mg total) by mouth 2 (two) times daily.      amiodarone 200 MG tablet   Commonly known as: PACERONE   Take 1 tablet (200 mg total) by mouth daily.   Start taking on: 05/27/2011      fish oil-omega-3 fatty acids 1000 MG capsule   Take 2 g by mouth daily.      glimepiride 4 MG tablet   Commonly known as: AMARYL   Take 4 mg by mouth daily before breakfast.      metoprolol succinate 100 MG 24 hr tablet   Commonly known as: TOPROL-XL   Take 100 mg by mouth daily. Take with or immediately following a meal.      mulitivitamin with minerals Tabs   Take 1 tablet by mouth daily.      pantoprazole 40 MG tablet   Commonly known as: PROTONIX   Take 40 mg by mouth daily.      PRESERVISION AREDS 2 PO   Take 2 capsules by mouth daily.      rosuvastatin 5 MG tablet   Commonly known as: CRESTOR   Take 5 mg by mouth daily.      valsartan-hydrochlorothiazide 320-12.5 MG per tablet   Commonly known as: DIOVAN-HCT   Take 1 tablet by mouth daily.      warfarin 3 MG tablet   Commonly known as: COUMADIN   Take 1.5-3 mg by mouth every evening. MWF-0.5 half tab (1.5 mg), All other days- 1 tab (3 mg)           Hospital Course: Present on Admission:  78 year-old female with history of atrial fibrillation, diabetes  mellitus type 2, history of breast cancer status post mastectomy in the 90s, hypertension was experiencing multiple episodes of passing out like spells. Her daughter when she checked her heart rate was found to be high. Patient was brought to the ER. In the ER patient's heart rate was found to be going from 60s to 120. In A. fib.   Symptomatic bradycardia  Patient is now status post permanent pacemaker placement as per Dr. Ladona Ridgel with [facility] cardiology on 05/17/2011.  She has done well in regard to her pacemaker function.  Wound care is to be prescribed by the [facility] team prior to her discharge.  Followup is as described above with specific details being provided directed to the patient by the [facility] team.  Near syncope  Resolved - likely etiology from symptomatic bradycardia due to post-termination pauses.   Tachy-brady syndrome/A-fib  The patient's bradycardic spells of course resolved after the pacemaker placement was accomplished.  In the post pacemaker period owever she did have difficulty with significant tachycardia which remained.  As a result the patient was loaded with  IV amiodarone and then transitioned to complete 5 additional days of an oral load.  She was in good daily dosing.  After completion of her IV amiodarone load her heart rate has been well controlled in the 60-80 range with only occasional episodes of heart rates at 120 with physical exertion.  Chronic systemic anticoagulation  The pharmacy has been managing the patient's anticoagulation to her hospital stay.  She is advised to followup at the Coumadin clinic through [facility] as prior to her admission.  She is advised that she will need to followup within 48 hours to assure that no further titration of her Coumadin dosing is needed.  Diabetes mellitus  The patient's diabetes has been reasonably controlled throughout her hospital stay and no long-term changes were made in her home regimen.  HTN (hypertension)  The patient's  blood pressure has been well controlled during her hospital stay.  She is being discharged on the above noted treatment regimen.  Dyslipidemia  Continue omega-3 fatty acids as well as Crestor.  Mild renal insufficiency During the patient's hospital stay her creatinine fluctuated from 1-1.5.  it is felt that this is likely related to decreased intake and mild volume depletion.  At the time of discharge it was not felt wise to resume her diuretic.  It is recommended that a basic metabolic panel be checked with an approximate 5 days.  The patient has been advised of this and reports that she should be able to schedule this with her primary care physician without significant difficulty.   Day of Discharge BP 147/67  Pulse 60  Temp(Src) 97.7 F (36.5 C) (Oral)  Resp 18  Ht 5\' 3"  (1.6 m)  Wt 67.2 kg (148 lb 2.4 oz)  BMI 26.24 kg/m2  SpO2 98%  Physical Exam: General: No acute respiratory distress Lungs: Clear to auscultation bilaterally without wheezes or crackles Cardiovascular: Regular rate and rhythm without murmur gallop or rub normal S1 and S2 Abdomen: Nontender, nondistended, soft, bowel sounds positive, no rebound, no ascites, no appreciable mass Extremities: No significant cyanosis, clubbing, or edema bilateral lower extremities  PROTIME-INR     Status: Abnormal   Collection Time   05/21/11  5:16 AM      Component Value Range   Prothrombin Time 20.5 (*) 11.6 - 15.2 (seconds)   INR 1.72 (*) 0.00 - 1.49   CBC     Status: Abnormal   Collection Time   05/21/11  5:16 AM      Component Value Range   WBC 9.1  4.0 - 10.5 (K/uL)   RBC 4.15  3.87 - 5.11 (MIL/uL)   Hemoglobin 11.2 (*) 12.0 - 15.0 (g/dL)   HCT 78.2 (*) 95.6 - 46.0 (%)   MCV 84.8  78.0 - 100.0 (fL)   MCH 27.0  26.0 - 34.0 (pg)   MCHC 31.8  30.0 - 36.0 (g/dL)   RDW 21.3 (*) 08.6 - 15.5 (%)   Platelets 183  150 - 400 (K/uL)  BASIC METABOLIC PANEL     Status: Abnormal   Collection Time   05/21/11  5:16 AM       Component Value Range   Sodium 137  135 - 145 (mEq/L)   Potassium 4.0  3.5 - 5.1 (mEq/L)   Chloride 100  96 - 112 (mEq/L)   CO2 28  19 - 32 (mEq/L)   Glucose, Bld 85  70 - 99 (mg/dL)   BUN 46 (*) 6 - 23 (mg/dL)   Creatinine, Ser 5.78 (*)  0.50 - 1.10 (mg/dL)   Calcium 9.5  8.4 - 91.4 (mg/dL)   GFR calc non Af Amer 29 (*) >90 (mL/min)   GFR calc Af Amer 34 (*) >90 (mL/min)  MAGNESIUM     Status: Normal   Collection Time   05/21/11  5:16 AM      Component Value Range   Magnesium 2.1  1.5 - 2.5 (mg/dL)    Disposition: Discharge home with home health PT and OT   Follow-up Appts: Discharge Orders    Future Appointments: Provider: Department: Dept Phone: Center:   05/26/2011 3:15 PM Christain Sacramento [place], RN [facility] [phone] [id number]   08/21/2011 9:30 AM Lewayne Bunting, MD [facility] [phone] [id number]     Future Orders Please Complete By Expires   Diet Carb Modified      Increase activity slowly      Discharge wound care:      Comments:   As directed by the [facility] Cardiology Team        Follow-up Information    Follow up with [facility]. Schedule an appointment as soon as possible for a visit in 2 days.      Follow up with Michiel Sites, MD in 5 days. (for a check of your renal function (BMET))    Contact information:   [address] [place] [address] [phone]       Follow up with Lewayne Bunting, MD in 3 months. (for a pacemaker check)    Contact information:   [address] [place] [address] [phone]       Follow up with [facility] in 10 days. (for a wound check)          Tests Needing Follow-up: You will need to have your blood thinner checked within 48 hours and will also need to have your renal function checked within 5 days  Time spent in discharge (includes decision making & examination of pt): >30  minutes  Signed: MCCLUNG,JEFFREY T 05/21/2011, 12:57 PM

## 2011-05-26 ENCOUNTER — Ambulatory Visit (INDEPENDENT_AMBULATORY_CARE_PROVIDER_SITE_OTHER): Payer: Medicare Other | Admitting: *Deleted

## 2011-05-26 ENCOUNTER — Encounter: Payer: Self-pay | Admitting: Internal Medicine

## 2011-05-26 DIAGNOSIS — I495 Sick sinus syndrome: Secondary | ICD-10-CM

## 2011-05-26 LAB — PACEMAKER DEVICE OBSERVATION
AL AMPLITUDE: 3.2 mv
BAMS-0001: 150 {beats}/min
BAMS-0003: 70 {beats}/min
BATTERY VOLTAGE: 3.0982 V

## 2011-05-26 NOTE — Progress Notes (Signed)
Wound check-PPM 

## 2011-06-13 ENCOUNTER — Telehealth: Payer: Self-pay | Admitting: Internal Medicine

## 2011-06-13 NOTE — Telephone Encounter (Signed)
New msg Pt wants to know if she should have antibiotics before dental procedures Please call

## 2011-06-13 NOTE — Telephone Encounter (Signed)
Please advise regarding patient question.

## 2011-06-13 NOTE — Telephone Encounter (Signed)
Please ask Dr Ladona Ridgel and call patient.  She will be following up in the [place] office

## 2011-06-14 MED ORDER — AMOXICILLIN 500 MG PO CAPS: ORAL_CAPSULE | ORAL | Status: DC

## 2011-06-14 NOTE — Telephone Encounter (Signed)
Addended by: Dennis Bast F on: 06/14/2011 05:41 PM   Modules accepted: Orders

## 2011-06-14 NOTE — Telephone Encounter (Signed)
Fu call Pt's husband called and wanted to know about antibiotics Please call

## 2011-06-14 NOTE — Telephone Encounter (Signed)
Dr Ladona Ridgel says to cover her for 3 months s/p PPM implant. Will need Amoxicillin 2grams 30 min prior to procedure  She will only be required to take antibiotics for 3 months s/p Pacer implant for procedures  Spoke with her daughter in law and they will pick up rx

## 2011-06-28 ENCOUNTER — Other Ambulatory Visit: Payer: Self-pay | Admitting: *Deleted

## 2011-06-28 ENCOUNTER — Telehealth: Payer: Self-pay | Admitting: Internal Medicine

## 2011-06-28 MED ORDER — AMIODARONE HCL 200 MG PO TABS: 200.0000 mg | ORAL_TABLET | Freq: Every day | ORAL | Status: DC

## 2011-06-28 NOTE — Telephone Encounter (Signed)
Pt was put on amiodarone 200mg   in hospital and is out of pills needs new [facility] [place]. She does not have any to take for today.

## 2011-06-28 NOTE — Telephone Encounter (Signed)
Verified dose of amiodarone with husband to be 200 mg daily.  States that she has INR checked with primary MD.  Appointment made for post hospital follow up with Joni Reining, NP.

## 2011-07-04 ENCOUNTER — Encounter: Payer: Self-pay | Admitting: Adult Health

## 2011-07-04 ENCOUNTER — Encounter: Payer: Medicare Other | Admitting: Adult Health

## 2011-07-06 ENCOUNTER — Encounter: Payer: Self-pay | Admitting: Adult Health

## 2011-07-06 ENCOUNTER — Ambulatory Visit (INDEPENDENT_AMBULATORY_CARE_PROVIDER_SITE_OTHER): Payer: Medicare Other | Admitting: Adult Health

## 2011-07-06 ENCOUNTER — Telehealth: Payer: Self-pay

## 2011-07-06 VITALS — BP 162/75 | HR 59 | Ht 61.0 in | Wt 151.0 lb

## 2011-07-06 DIAGNOSIS — R609 Edema, unspecified: Secondary | ICD-10-CM

## 2011-07-06 DIAGNOSIS — I4891 Unspecified atrial fibrillation: Secondary | ICD-10-CM

## 2011-07-06 DIAGNOSIS — R001 Bradycardia, unspecified: Secondary | ICD-10-CM

## 2011-07-06 DIAGNOSIS — I498 Other specified cardiac arrhythmias: Secondary | ICD-10-CM

## 2011-07-06 DIAGNOSIS — E119 Type 2 diabetes mellitus without complications: Secondary | ICD-10-CM

## 2011-07-06 DIAGNOSIS — I10 Essential (primary) hypertension: Secondary | ICD-10-CM

## 2011-07-06 MED ORDER — AMIODARONE HCL 200 MG PO TABS: 200.0000 mg | ORAL_TABLET | Freq: Every day | ORAL | Status: DC

## 2011-07-06 NOTE — Telephone Encounter (Signed)
**Note De-Identified Mirai Greenwood Obfuscation** Per Joni Reining, NP pt's son, Marcy Salvo, is advised that pt. needs to have Echo to access LV function. He verbalized understanding but asked that I call him on Monday to schedule./LV

## 2011-07-06 NOTE — Progress Notes (Signed)
HPI: Isabella Chen is a pleasant 78 y/o patient of Dr. Ladona Ridgel we are seeing post-hospitalization after placement of [employer] DR dual-chamber pacemaker, serial number [id number]. She had this placed in the setting of symptomatic bradycardia with pauses causing her to feel as if she were "fading out.", history of atrial fibrillation on coumadin, followed by Dr. Marylen Ponto office in [place], hypertension, and chronic LEE. She has been placed on amiodarone since admission with dose now at 200mg  daily. She was taken off of toresemide during recent admission secondary to mild dehydration with Creatinine at 1.3 to 1.5. She has gained 7 lbs since discharge and complaints of fluid retention in her :LE.  She denies chest pain, shortness of breath or weakness.   No Known Allergies  Current Outpatient Prescriptions  Medication Sig Dispense Refill  . amiodarone (PACERONE) 200 MG tablet Take 1 tablet (200 mg total) by mouth daily.  90 tablet  3  . fish oil-omega-3 fatty acids 1000 MG capsule Take 2 g by mouth daily.      Marland Kitchen glimepiride (AMARYL) 4 MG tablet Take 4 mg by mouth daily before breakfast.      . metoprolol succinate (TOPROL-XL) 100 MG 24 hr tablet Take 100 mg by mouth daily. Take with or immediately following a meal.      . Multiple Vitamin (MULITIVITAMIN WITH MINERALS) TABS Take 1 tablet by mouth daily.      . Multiple Vitamins-Minerals (PRESERVISION AREDS 2 PO) Take 2 capsules by mouth daily.      . pantoprazole (PROTONIX) 40 MG tablet Take 40 mg by mouth daily.      . rosuvastatin (CRESTOR) 5 MG tablet Take 5 mg by mouth daily.      . valsartan-hydrochlorothiazide (DIOVAN-HCT) 320-12.5 MG per tablet Take 1 tablet by mouth daily.      Marland Kitchen warfarin (COUMADIN) 3 MG tablet Take 1.5-3 mg by mouth every evening. MWF-0.5 half tab (1.5 mg), All other days- 1 tab (3 mg)      . amiodarone (PACERONE) 200 MG tablet Take 1 tablet (200 mg total) by mouth 2 (two) times daily.  10 tablet  0    Past Medical History    Diagnosis Date  . Diabetes mellitus   . Macular degeneration syndrome   . Dysrhythmia     Past Surgical History  Procedure Date  . Mastectomy   . Bowel resection   . Abdominal hysterectomy   . Ovarian cyst removal   . Tonsillectomy     ION:GEXBMW of systems complete and found to be negative unless listed above PHYSICAL EXAM BP 162/75  Pulse 59  Ht 5\' 1"  (1.549 m)  Wt 151 lb (68.493 kg)  BMI 28.53 kg/m2  General: Well developed, well nourished, in no acute distress Head: Eyes PERRLA, No xanthomas.   Normal cephalic and atramatic  Lungs: Clear bilaterally to auscultation and percussion. Heart: HRRR S1 S2, 2/6 systolic murmur heard best at the apex.  Pulses are 2+ & equal.            No carotid bruit. No JVD.  No abdominal bruits. No femoral bruits. Abdomen: Bowel sounds are positive, abdomen soft and non-tender without masses or                  Hernia's noted. Msk:  Back normal, normal gait. Normal strength and tone for age.Pacemaker insertion site well healed without evidence of infection. Extremities: No clubbing, cyanosis 2+ pitting edema in the LE bilaterally, some woody  appearance is noted. Neuro: Alert and oriented X 3. HOH Psych:  Good affect, responds appropriately   ASSESSMENT AND PLAN

## 2011-07-06 NOTE — Patient Instructions (Addendum)
Your physician recommends that you schedule a follow-up appointment in: 6 MONTHS  Your physician recommends that you return for lab work in: 1 WEEK  Your physician has recommended you make the following change in your medication:  TAKE TORSEMIDE AND POTASSIUM X 2 DAYS TO ACHIEVE WT OF 145 POUNDS AND WEIGH DAILY  FOLLOW UP WITH PRIMARY PHYSICIAN

## 2011-07-06 NOTE — Progress Notes (Signed)
**Note De-Identified Isabella Chen Obfuscation** Addended by: Demetrios Loll on: 07/06/2011 04:35 PM   Modules accepted: Orders

## 2011-07-06 NOTE — Assessment & Plan Note (Signed)
This is her first time in our office in [place] s/p hospitalization for tachybrady syndrome with pauses, requiring a pacemaker. She is feeling better and does not have the "fading out" feelings she was experiencing prior to implant. She did follow up with PPM clinic for check and was without evidence of infection or bleeding.;  She will continue current medication regimen with the addition of torsemide prn. She has gained 7 lbs since discharge and is having significant LEE. No complaints of chest pain or DOE. She had been on torsemide but this was stopped secondary to renal insufficiency. Follow-up labs will be ordered, BMET. She will need an echocardiogram as well for LV fx.  I have looked through recent hospital charts and not found any prior echo being completed. She will be seen in 6 months unless she is symptomatic. She is to follow in [facility] clinic as scheduled.

## 2011-07-11 NOTE — Telephone Encounter (Signed)
Echo is scheduled for 07-13-11 at 1:00 pm, pt. aware./LV

## 2011-07-13 ENCOUNTER — Ambulatory Visit (HOSPITAL_COMMUNITY)
Admission: RE | Admit: 2011-07-13 | Discharge: 2011-07-13 | Disposition: A | Discharge status: Home / Self Care | Payer: Medicare Other | Source: Ambulatory Visit | Attending: Adult Health | Admitting: Adult Health

## 2011-07-13 DIAGNOSIS — I4891 Unspecified atrial fibrillation: Secondary | ICD-10-CM | POA: Insufficient documentation

## 2011-07-13 DIAGNOSIS — I10 Essential (primary) hypertension: Secondary | ICD-10-CM | POA: Insufficient documentation

## 2011-07-13 DIAGNOSIS — E119 Type 2 diabetes mellitus without complications: Secondary | ICD-10-CM | POA: Insufficient documentation

## 2011-07-13 DIAGNOSIS — I369 Nonrheumatic tricuspid valve disorder, unspecified: Secondary | ICD-10-CM

## 2011-07-13 NOTE — Progress Notes (Signed)
*  PRELIMINARY RESULTS* Echocardiogram 2D Echocardiogram has been performed.  Isabella Chen 07/13/2011, 1:37 PM

## 2011-08-21 ENCOUNTER — Ambulatory Visit (INDEPENDENT_AMBULATORY_CARE_PROVIDER_SITE_OTHER): Payer: Medicare Other | Admitting: Internal Medicine

## 2011-08-21 ENCOUNTER — Encounter: Payer: Self-pay | Admitting: Internal Medicine

## 2011-08-21 VITALS — BP 136/76 | HR 61 | Resp 16 | Ht 61.0 in | Wt 149.0 lb

## 2011-08-21 DIAGNOSIS — Z950 Presence of cardiac pacemaker: Secondary | ICD-10-CM | POA: Insufficient documentation

## 2011-08-21 DIAGNOSIS — I498 Other specified cardiac arrhythmias: Secondary | ICD-10-CM

## 2011-08-21 DIAGNOSIS — I4891 Unspecified atrial fibrillation: Secondary | ICD-10-CM

## 2011-08-21 DIAGNOSIS — I10 Essential (primary) hypertension: Secondary | ICD-10-CM

## 2011-08-21 DIAGNOSIS — R001 Bradycardia, unspecified: Secondary | ICD-10-CM

## 2011-08-21 LAB — PACEMAKER DEVICE OBSERVATION
AL AMPLITUDE: 5 mv
AL IMPEDENCE PM: 475 Ohm
BATTERY VOLTAGE: 2.9478 V
RV LEAD AMPLITUDE: 10.2 mv
RV LEAD IMPEDENCE PM: 512.5 Ohm

## 2011-08-21 NOTE — Assessment & Plan Note (Signed)
Pacemaker interrogation demonstrates that she is maintaining sinus rhythm 95% of the time. She will continue on her current dose of amiodarone at 200 mg daily.

## 2011-08-21 NOTE — Progress Notes (Signed)
HPI Isabella Chen returns today for followup. She is a very pleasant 78 year old woman with symptomatic bradycardia, paroxysmal atrial fibrillation, status post permanent pacemaker insertion, history of Strokes, status post warfarin therapy. In the interim, the patient has been stable. She denies chest pain, shortness of breath, or peripheral edema. She typically gets up once at night to use the bathroom. When she does, she notes palpitations which resolved within a few minutes of lying back down. No Known Allergies   Current Outpatient Prescriptions  Medication Sig Dispense Refill  . amiodarone (PACERONE) 200 MG tablet Take 1 tablet (200 mg total) by mouth daily.  90 tablet  3  . fish oil-omega-3 fatty acids 1000 MG capsule Take 2 g by mouth daily.      Marland Kitchen glimepiride (AMARYL) 4 MG tablet Take 4 mg by mouth daily before breakfast.      . metoprolol succinate (TOPROL-XL) 100 MG 24 hr tablet Take 100 mg by mouth daily. Take with or immediately following a meal.      . Multiple Vitamin (MULITIVITAMIN WITH MINERALS) TABS Take 1 tablet by mouth daily.      . Multiple Vitamins-Minerals (PRESERVISION AREDS 2 PO) Take 2 capsules by mouth daily.      . pantoprazole (PROTONIX) 40 MG tablet Take 40 mg by mouth daily.      . potassium chloride SA (K-DUR,KLOR-CON) 20 MEQ tablet Take 20 mEq by mouth daily.      . rosuvastatin (CRESTOR) 5 MG tablet Take 5 mg by mouth daily.      Marland Kitchen torsemide (DEMADEX) 100 MG tablet Take 100 mg by mouth daily.      . valsartan-hydrochlorothiazide (DIOVAN-HCT) 320-12.5 MG per tablet Take 1 tablet by mouth daily.      Marland Kitchen warfarin (COUMADIN) 3 MG tablet Take 1.5-3 mg by mouth every evening. MWF-0.5 half tab (1.5 mg), All other days- 1 tab (3 mg)      . amiodarone (PACERONE) 200 MG tablet Take 1 tablet (200 mg total) by mouth 2 (two) times daily.  10 tablet  0     Past Medical History  Diagnosis Date  . Diabetes mellitus   . Macular degeneration syndrome   . Dysrhythmia      ROS:   All systems reviewed and negative except as noted in the HPI.   Past Surgical History  Procedure Date  . Mastectomy   . Bowel resection   . Abdominal hysterectomy   . Ovarian cyst removal   . Tonsillectomy      No family history on file.   History   Social History  . Marital Status: Widowed    Spouse Name: N/A    Number of Children: N/A  . Years of Education: N/A   Occupational History  . Not on file.   Social History Main Topics  . Smoking status: Never Smoker   . Smokeless tobacco: Not on file  . Alcohol Use: No  . Drug Use: No  . Sexually Active: No   Other Topics Concern  . Not on file   Social History Narrative  . No narrative on file     BP 136/76  Pulse 61  Resp 16  Ht 5\' 1"  (1.549 m)  Wt 149 lb (67.586 kg)  BMI 28.15 kg/m2  Physical Exam:  Well appearing NAD HEENT: Unremarkable Neck:  No JVD, no thyromegally Lungs:  Clear with no wheezes, rales, or rhonchi. Well-healed pacemaker incision. HEART:  Regular rate rhythm, no murmurs, no rubs, no clicks  Abd:  soft, positive bowel sounds, no organomegally, no rebound, no guarding Ext:  2 plus pulses, no edema, no cyanosis, no clubbing Skin:  No rashes no nodules Neuro:  CN II through XII intact, motor grossly intact except for slurred speech.  DEVICE  Normal device function.  See PaceArt for details.   Assess/Plan:

## 2011-08-21 NOTE — Patient Instructions (Signed)
**Note De-Identified Solomon Skowronek Obfuscation** Your physician recommends that you continue on your current medications as directed. Please refer to the Current Medication list given to you today.  Your physician recommends that you schedule a follow-up appointment in: February

## 2011-08-21 NOTE — Assessment & Plan Note (Signed)
Her device is working normally. We'll plan to recheck in several months. 

## 2011-08-21 NOTE — Assessment & Plan Note (Signed)
Her blood pressure is well controlled. She will maintain a low-sodium diet and her current medical therapy.

## 2011-08-23 ENCOUNTER — Encounter: Payer: Self-pay | Admitting: Internal Medicine

## 2011-12-22 ENCOUNTER — Ambulatory Visit (INDEPENDENT_AMBULATORY_CARE_PROVIDER_SITE_OTHER): Payer: Medicare Other | Admitting: Adult Health

## 2011-12-22 ENCOUNTER — Encounter: Payer: Self-pay | Admitting: Adult Health

## 2011-12-22 VITALS — BP 106/62 | HR 60 | Ht 61.0 in | Wt 141.1 lb

## 2011-12-22 DIAGNOSIS — R001 Bradycardia, unspecified: Secondary | ICD-10-CM

## 2011-12-22 DIAGNOSIS — I10 Essential (primary) hypertension: Secondary | ICD-10-CM

## 2011-12-22 DIAGNOSIS — I498 Other specified cardiac arrhythmias: Secondary | ICD-10-CM

## 2011-12-22 DIAGNOSIS — I4891 Unspecified atrial fibrillation: Secondary | ICD-10-CM

## 2011-12-22 LAB — CBC WITH DIFFERENTIAL/PLATELET
Basophils Relative: 0 % (ref 0–1)
Eosinophils Absolute: 0.2 10*3/uL (ref 0.0–0.7)
HCT: 35.5 % — ABNORMAL LOW (ref 36.0–46.0)
Hemoglobin: 11.6 g/dL — ABNORMAL LOW (ref 12.0–15.0)
MCH: 25.2 pg — ABNORMAL LOW (ref 26.0–34.0)
MCHC: 32.7 g/dL (ref 30.0–36.0)
Monocytes Absolute: 0.8 10*3/uL (ref 0.1–1.0)
Monocytes Relative: 17 % — ABNORMAL HIGH (ref 3–12)

## 2011-12-22 NOTE — Assessment & Plan Note (Signed)
Excellent control of blood pressure. She has no complaints of lightheadedness or dizziness or pre-syncope with position change. She will continue on medications as directed. She is changed to [facility] to when necessary per Dr. Juleen China.

## 2011-12-22 NOTE — Progress Notes (Signed)
HPI: Isabella Chen is a [age]-year-old patient of Dr. Ladona Ridgel we are seeing for ongoing assessment and treatment after placement of a St. Jude accident DR dual-chamber pacemaker in a great 2013, history of hypertension, and atrial fibrillation on Coumadin. She is followed by her primary care physician Dr. Juleen China for labs. She comes today with complaints of leg pain and skin erosions on her leg, with some lower back pain. She is seeing a dermatologist for this. On last visit the patient had an echocardiogram completed revealing an EF of 55-60% with mild to moderate LVH with disproportionate septal hypertrophy. She had mildly elevated PA P. without pressure of 49 mmHg. she has no complaints of racing heart, chest pain, or presyncope. She has no complaints of bleeding issues. She was seen by Dr. Ladona Ridgel in May for post-pacemaker evaluation. She states that she does not have a remote access for pacemaker interrogation at home, but has a box and is questioning whether or not she should be in touch with our office concerning this.  No Known Allergies  Current Outpatient Prescriptions  Medication Sig Dispense Refill  . amiodarone (PACERONE) 200 MG tablet Take 1 tablet (200 mg total) by mouth daily.  90 tablet  3  . fish oil-omega-3 fatty acids 1000 MG capsule Take 2 g by mouth daily.      Marland Kitchen glimepiride (AMARYL) 4 MG tablet Take 4 mg by mouth daily before breakfast.      . metoprolol succinate (TOPROL-XL) 100 MG 24 hr tablet Take 100 mg by mouth daily. Take with or immediately following a meal.      . Multiple Vitamin (MULITIVITAMIN WITH MINERALS) TABS Take 1 tablet by mouth daily.      . Multiple Vitamins-Minerals (PRESERVISION AREDS 2 PO) Take 2 capsules by mouth daily.      . pantoprazole (PROTONIX) 40 MG tablet Take 40 mg by mouth daily.      . potassium chloride SA (K-DUR,KLOR-CON) 20 MEQ tablet Take 20 mEq by mouth daily.      . rosuvastatin (CRESTOR) 5 MG tablet Take 5 mg by mouth daily.      Marland Kitchen torsemide  (DEMADEX) 100 MG tablet Take 100 mg by mouth daily.      . valsartan-hydrochlorothiazide (DIOVAN-HCT) 320-12.5 MG per tablet Take 1 tablet by mouth daily.      Marland Kitchen warfarin (COUMADIN) 3 MG tablet Take 1.5-3 mg by mouth every evening. MWF-0.5 half tab (1.5 mg), All other days- 1 tab (3 mg)      . amiodarone (PACERONE) 200 MG tablet Take 1 tablet (200 mg total) by mouth 2 (two) times daily.  10 tablet  0    Past Medical History  Diagnosis Date  . Diabetes mellitus   . Macular degeneration syndrome   . Dysrhythmia     Past Surgical History  Procedure Date  . Mastectomy   . Bowel resection   . Abdominal hysterectomy   . Ovarian cyst removal   . Tonsillectomy     WUJ:WJXBJY of systems complete and found to be negative unless listed above  PHYSICAL EXAM BP 106/62  Pulse 60  Ht 5\' 1"  (1.549 m)  Wt 141 lb 1.9 oz (64.012 kg)  BMI 26.66 kg/m2  General: Well developed, well nourished, in no acute distress Head: Eyes PERRLA, left eye lid is drooping. No xanthomas.   Normal cephalic and atramatic  Lungs: Clear bilaterally to auscultation and percussion. Heart: HRRR S1 S2,   Pulses are 2+ & equal.  No carotid bruit. No JVD.  No abdominal bruits. No femoral bruits. Abdomen: Bowel sounds are positive, abdomen soft and non-tender without masses or                  Hernia's noted. Msk:  Back mild kyphosis, normal but slow gait. Normal strength and tone for age. Extremities: No clubbing, cyanosis or edema. Woody skin changes are noted,  DP +1 Neuro: Alert and oriented X 3. Psych:  Good affect, responds appropriately  EAV:WUJWJX sensing, rate of 60 bpm.  ASSESSMENT AND PLAN

## 2011-12-22 NOTE — Patient Instructions (Addendum)
Your physician recommends that you to have lab work today  CBC, BMP,TSH.  We will call you with your results  Your physician recommends that you continue on your current medications as directed. Please refer to the Current Medication list given to you today.  Your physician wants you to follow-up in: 6 months with [facility]. You will receive a reminder letter in the mail two months in advance. If you don't receive a letter, please call our office to schedule the follow-up appointment.  At your next appointment with Dr. Ladona Ridgel in 2014 they will teach you how to use your remote device check equipment for your pacemaker.

## 2011-12-22 NOTE — Assessment & Plan Note (Signed)
I have asked our pacemaker clinic to be in touch with her concerning her remote access. They will be calling her at home to explain to her the use of this device and to set up a time to have her pacemaker interrogated over the phone. She will followup with Dr. Ladona Ridgel as directed.

## 2011-12-23 LAB — BASIC METABOLIC PANEL
BUN: 50 mg/dL — ABNORMAL HIGH (ref 6–23)
CO2: 29 mEq/L (ref 19–32)
Calcium: 9.9 mg/dL (ref 8.4–10.5)
Glucose, Bld: 159 mg/dL — ABNORMAL HIGH (ref 70–99)
Potassium: 4.6 mEq/L (ref 3.5–5.3)

## 2011-12-23 LAB — TSH: TSH: 2.478 u[IU]/mL (ref 0.350–4.500)

## 2011-12-25 ENCOUNTER — Other Ambulatory Visit: Payer: Self-pay | Admitting: *Deleted

## 2011-12-25 ENCOUNTER — Encounter: Payer: Self-pay | Admitting: *Deleted

## 2011-12-25 DIAGNOSIS — N289 Disorder of kidney and ureter, unspecified: Secondary | ICD-10-CM

## 2011-12-25 MED ORDER — TORSEMIDE 20 MG PO TABS: 80.0000 mg | ORAL_TABLET | Freq: Every day | ORAL | Status: DC

## 2012-01-02 ENCOUNTER — Ambulatory Visit: Payer: Medicare Other | Admitting: Adult Health

## 2012-01-08 ENCOUNTER — Other Ambulatory Visit: Payer: Self-pay | Admitting: Adult Health

## 2012-01-09 ENCOUNTER — Encounter: Payer: Self-pay | Admitting: Cardiology

## 2012-01-09 LAB — BASIC METABOLIC PANEL
BUN: 82 mg/dL — ABNORMAL HIGH (ref 6–23)
CO2: 30 mEq/L (ref 19–32)
Calcium: 10.7 mg/dL — ABNORMAL HIGH (ref 8.4–10.5)
Creat: 3.6 mg/dL — ABNORMAL HIGH (ref 0.50–1.10)

## 2012-01-12 ENCOUNTER — Other Ambulatory Visit: Payer: Self-pay | Admitting: *Deleted

## 2012-01-12 ENCOUNTER — Encounter: Payer: Self-pay | Admitting: *Deleted

## 2012-01-12 DIAGNOSIS — N289 Disorder of kidney and ureter, unspecified: Secondary | ICD-10-CM

## 2012-01-12 MED ORDER — TORSEMIDE 100 MG PO TABS: 50.0000 mg | ORAL_TABLET | Freq: Every day | ORAL | Status: DC

## 2012-01-24 ENCOUNTER — Other Ambulatory Visit: Payer: Self-pay | Admitting: Endocrinology

## 2012-01-24 DIAGNOSIS — R1310 Dysphagia, unspecified: Secondary | ICD-10-CM

## 2012-01-26 ENCOUNTER — Other Ambulatory Visit: Payer: Self-pay | Admitting: *Deleted

## 2012-01-26 ENCOUNTER — Other Ambulatory Visit: Payer: Self-pay | Admitting: Cardiology

## 2012-01-26 DIAGNOSIS — N289 Disorder of kidney and ureter, unspecified: Secondary | ICD-10-CM

## 2012-01-26 LAB — BASIC METABOLIC PANEL
BUN: 45 mg/dL — ABNORMAL HIGH (ref 6–23)
Chloride: 100 mEq/L (ref 96–112)
Glucose, Bld: 85 mg/dL (ref 70–99)
Potassium: 5.3 mEq/L (ref 3.5–5.3)
Sodium: 138 mEq/L (ref 135–145)

## 2012-01-30 ENCOUNTER — Encounter: Payer: Self-pay | Admitting: Cardiology

## 2012-02-01 ENCOUNTER — Other Ambulatory Visit: Payer: Self-pay | Admitting: Endocrinology

## 2012-02-01 ENCOUNTER — Other Ambulatory Visit: Payer: Self-pay | Admitting: *Deleted

## 2012-02-01 ENCOUNTER — Ambulatory Visit
Admission: RE | Admit: 2012-02-01 | Discharge: 2012-02-01 | Disposition: A | Discharge status: Home / Self Care | Payer: Medicare Other | Source: Ambulatory Visit | Attending: Endocrinology | Admitting: Endocrinology

## 2012-02-01 DIAGNOSIS — I10 Essential (primary) hypertension: Secondary | ICD-10-CM

## 2012-02-01 DIAGNOSIS — R1310 Dysphagia, unspecified: Secondary | ICD-10-CM

## 2012-02-05 ENCOUNTER — Encounter: Payer: Self-pay | Admitting: Cardiology

## 2012-02-05 ENCOUNTER — Ambulatory Visit (INDEPENDENT_AMBULATORY_CARE_PROVIDER_SITE_OTHER): Payer: Medicare Other | Admitting: Cardiology

## 2012-02-05 VITALS — BP 90/53 | HR 60 | Ht 61.0 in | Wt 137.1 lb

## 2012-02-05 DIAGNOSIS — Z79899 Other long term (current) drug therapy: Secondary | ICD-10-CM

## 2012-02-05 DIAGNOSIS — Z950 Presence of cardiac pacemaker: Secondary | ICD-10-CM

## 2012-02-05 DIAGNOSIS — N184 Chronic kidney disease, stage 4 (severe): Secondary | ICD-10-CM

## 2012-02-05 DIAGNOSIS — E119 Type 2 diabetes mellitus without complications: Secondary | ICD-10-CM | POA: Insufficient documentation

## 2012-02-05 DIAGNOSIS — D649 Anemia, unspecified: Secondary | ICD-10-CM

## 2012-02-05 DIAGNOSIS — I495 Sick sinus syndrome: Secondary | ICD-10-CM | POA: Insufficient documentation

## 2012-02-05 DIAGNOSIS — H3530 Unspecified macular degeneration: Secondary | ICD-10-CM | POA: Insufficient documentation

## 2012-02-05 DIAGNOSIS — I10 Essential (primary) hypertension: Secondary | ICD-10-CM

## 2012-02-05 DIAGNOSIS — Z5181 Encounter for therapeutic drug level monitoring: Secondary | ICD-10-CM

## 2012-02-05 MED ORDER — METOPROLOL SUCCINATE ER 50 MG PO TB24: 50.0000 mg | ORAL_TABLET | Freq: Every day | ORAL | Status: AC

## 2012-02-05 MED ORDER — AMIODARONE HCL 100 MG PO TABS: 100.0000 mg | ORAL_TABLET | Freq: Every day | ORAL | Status: DC

## 2012-02-05 NOTE — Assessment & Plan Note (Signed)
Patient appears to be maintaining sinus rhythm with amiodarone.  Due to her relatively small body mass, dose will be decreased to 100 mg per day.  Monitoring laboratory and chest x-ray will be obtained.  Recent TSH was normal.

## 2012-02-05 NOTE — Progress Notes (Signed)
Patient ID: Isabella Chen, female   DOB: 22-Oct-1921, 78 y.o.   MRN: [id number]  HPI: Scheduled return visit for this pleasant elderly woman with sick sinus syndrome, prior dual chamber pacer insertion and paroxysmal atrial fibrillation.  Since her last visit, she has done generally well.  She is physically inactive, but has experienced no dyspnea or chest discomfort.  Short walks are well tolerated.   She has been maintained on amiodarone with no clinical evidence for recurrent arrhythmia.  Prior to Admission medications   Medication Sig Start Date End Date Taking? Authorizing Provider  amiodarone (PACERONE) 100 MG tablet Take 1 tablet (100 mg total) by mouth daily. 02/05/12 02/04/13 Yes Kathlen Brunswick, MD  fish oil-omega-3 fatty acids 1000 MG capsule Take 2 g by mouth daily.   Yes Historical Provider, MD  glimepiride (AMARYL) 4 MG tablet Take 4 mg by mouth daily before breakfast.   Yes Historical Provider, MD  metoprolol succinate (TOPROL-XL) 50 MG 24 hr tablet Take 1 tablet (50 mg total) by mouth daily. Take with or immediately following a meal. 02/05/12  Yes Kathlen Brunswick, MD  Multiple Vitamin (MULITIVITAMIN WITH MINERALS) TABS Take 1 tablet by mouth daily.   Yes Historical Provider, MD  Multiple Vitamins-Minerals (PRESERVISION AREDS 2 PO) Take 2 capsules by mouth daily.   Yes Historical Provider, MD  pantoprazole (PROTONIX) 40 MG tablet Take 40 mg by mouth daily.   Yes Historical Provider, MD  rosuvastatin (CRESTOR) 5 MG tablet Take 5 mg by mouth daily.   Yes Historical Provider, MD  torsemide (DEMADEX) 100 MG tablet Take 50 mg by mouth as needed. 01/12/12  Yes Kathlen Brunswick, MD  warfarin (COUMADIN) 3 MG tablet Take 1.5-3 mg by mouth every evening. MWF-0.5 half tab (1.5 mg), All other days- 1 tab (3 mg)   Yes Historical Provider, MD    No Known Allergies    Past medical history, social history, and family history reviewed and updated.  ROS: Denies orthopnea, PND, palpitations,  lightheadedness or syncope.  All other systems reviewed and are negative.  PHYSICAL EXAM: BP 90/53  Pulse 60  Ht 5\' 1"  (1.549 m)  Wt 62.197 kg (137 lb 1.9 oz)  BMI 25.91 kg/m2  General-Well developed; no acute distress; decreased hearing acuity Body habitus-proportionate weight and height Neck-No JVD Lungs-clear lung fields; resonant to percussion; mild to moderate kyphosis Cardiovascular-normal PMI; normal S1 and S2; grade 2-3/6 systolic ejection murmur at the cardiac base with minimal radiation to the carotids Abdomen-normal bowel sounds; soft and non-tender without masses or organomegaly Musculoskeletal-No deformities, no cyanosis or clubbing Neurologic-Normal cranial nerves; symmetric strength and tone; bradykinesia Skin-Warm, no significant lesions Extremities-distal pulses intact; no edema  EKG: Atrial pacing at a rate of 60 bpm with a PR interval of 280 ms; left bundle branch block; left axis deviation.  ASSESSMENT AND PLAN:  Dillingham Bing, MD 02/05/2012 4:44 PM

## 2012-02-05 NOTE — Assessment & Plan Note (Signed)
Patient has had a fairly rapid progression of renal insufficiency.  Basic laboratory studies including a urinalysis and renal ultrasound should be undertaken if not already performed.  Referral to a nephrologist advised-Dr. Juleen China will reassess at an upcoming office visit.

## 2012-02-05 NOTE — Progress Notes (Deleted)
Name: Isabella Chen    DOB: 1921/10/05  Age: 78 y.o.  MR#: [id number]       PCP:  Michiel Sites, MD      Insurance: @PAYORNAME @   CC:   No chief complaint on file.   VS BP 90/53  Pulse 60  Ht 5\' 1"  (1.549 m)  Wt 137 lb 1.9 oz (62.197 kg)  BMI 25.91 kg/m2  Weights Current Weight  02/05/12 137 lb 1.9 oz (62.197 kg)  12/22/11 141 lb 1.9 oz (64.012 kg)  08/21/11 149 lb (67.586 kg)    Blood Pressure  BP Readings from Last 3 Encounters:  02/05/12 90/53  12/22/11 106/62  08/21/11 136/76     Admit date:  (Not on file) Last encounter with RMR:  01/30/2012   Allergy No Known Allergies  Current Outpatient Prescriptions  Medication Sig Dispense Refill  . amiodarone (PACERONE) 200 MG tablet Take 1 tablet (200 mg total) by mouth daily.  90 tablet  3  . fish oil-omega-3 fatty acids 1000 MG capsule Take 2 g by mouth daily.      Marland Kitchen glimepiride (AMARYL) 4 MG tablet Take 4 mg by mouth daily before breakfast.      . metoprolol succinate (TOPROL-XL) 100 MG 24 hr tablet Take 100 mg by mouth daily. Take with or immediately following a meal.      . Multiple Vitamin (MULITIVITAMIN WITH MINERALS) TABS Take 1 tablet by mouth daily.      . Multiple Vitamins-Minerals (PRESERVISION AREDS 2 PO) Take 2 capsules by mouth daily.      . pantoprazole (PROTONIX) 40 MG tablet Take 40 mg by mouth daily.      . rosuvastatin (CRESTOR) 5 MG tablet Take 5 mg by mouth daily.      Marland Kitchen torsemide (DEMADEX) 100 MG tablet Take 0.5 tablets (50 mg total) by mouth daily.  15 tablet  11  . warfarin (COUMADIN) 3 MG tablet Take 1.5-3 mg by mouth every evening. MWF-0.5 half tab (1.5 mg), All other days- 1 tab (3 mg)      . DISCONTD: amiodarone (PACERONE) 200 MG tablet Take 1 tablet (200 mg total) by mouth 2 (two) times daily.  10 tablet  0    Discontinued Meds:    Medications Discontinued During This Encounter  Medication Reason  . amiodarone (PACERONE) 200 MG tablet Error  . valsartan-hydrochlorothiazide (DIOVAN-HCT)  320-12.5 MG per tablet Error    Patient Active Problem List  Diagnosis  . Hypertension  . Presence of permanent cardiac pacemaker  . Diabetes mellitus, type II  . Macular degeneration syndrome  . Sick sinus syndrome  . Chronic kidney disease, stage 4, severely decreased GFR  . Anemia, normocytic normochromic    LABS Orders Only on 01/26/2012  Component Date Value  . Sodium 01/26/2012 138   . Potassium 01/26/2012 5.3   . Chloride 01/26/2012 100   . CO2 01/26/2012 30   . Glucose, Bld 01/26/2012 85   . BUN 01/26/2012 45*  . Creat 01/26/2012 2.28*  . Calcium 01/26/2012 10.8*  Orders Only on 01/08/2012  Component Date Value  . Sodium 01/08/2012 134*  . Potassium 01/08/2012 3.7   . Chloride 01/08/2012 91*  . CO2 01/08/2012 30   . Glucose, Bld 01/08/2012 286*  . BUN 01/08/2012 82*  . Creat 01/08/2012 3.60*  . Calcium 01/08/2012 10.7*  Office Visit on 12/22/2011  Component Date Value  . WBC 12/22/2011 4.7   . RBC 12/22/2011 4.60   . Hemoglobin 12/22/2011 11.6*  .  HCT 12/22/2011 35.5*  . MCV 12/22/2011 77.2*  . [facility] 12/22/2011 25.2*  . MCHC 12/22/2011 32.7   . RDW 12/22/2011 19.3*  . Platelets 12/22/2011 254   . Neutrophils Relative 12/22/2011 53   . Neutro Abs 12/22/2011 2.5   . Lymphocytes Relative 12/22/2011 26   . Lymphs Abs 12/22/2011 1.2   . Monocytes Relative 12/22/2011 17*  . Monocytes Absolute 12/22/2011 0.8   . Eosinophils Relative 12/22/2011 4   . Eosinophils Absolute 12/22/2011 0.2   . Basophils Relative 12/22/2011 0   . Basophils Absolute 12/22/2011 0.0   . Smear Review 12/22/2011 Criteria for review not met   . Sodium 12/22/2011 137   . Potassium 12/22/2011 4.6   . Chloride 12/22/2011 100   . CO2 12/22/2011 29   . Glucose, Bld 12/22/2011 159*  . BUN 12/22/2011 50*  . Creat 12/22/2011 2.00*  . Calcium 12/22/2011 9.9   . TSH 12/22/2011 2.478      Results for this Opt Visit:     Results for orders placed in visit on 01/26/12  BASIC METABOLIC  PANEL      Component Value Range   Sodium 138  135 - 145 mEq/L   Potassium 5.3  3.5 - 5.3 mEq/L   Chloride 100  96 - 112 mEq/L   CO2 30  19 - 32 mEq/L   Glucose, Bld 85  70 - 99 mg/dL   BUN 45 (*) 6 - 23 mg/dL   Creat 1.61 (*) 0.96 - 1.10 mg/dL   Calcium 04.5 (*) 8.4 - 10.5 mg/dL    EKG Orders placed in visit on 12/22/11  . EKG 12-LEAD     Prior Assessment and Plan Problem List as of 02/05/2012            Cardiology Problems   Hypertension   Last Assessment & Plan Note   12/22/2011 Office Visit Signed 12/22/2011  2:01 PM by Jodelle Gross, NP    Excellent control of blood pressure. She has no complaints of lightheadedness or dizziness or pre-syncope with position change. She will continue on medications as directed. She is changed to [facility] to when necessary per Dr. Juleen China.    Sick sinus syndrome     Other   Presence of permanent cardiac pacemaker   Last Assessment & Plan Note   08/21/2011 Office Visit Signed 08/21/2011 11:16 AM by Marinus Maw, MD    Her device is working normally. We'll plan to recheck in several months.    Diabetes mellitus, type II   Macular degeneration syndrome   Chronic kidney disease, stage 4, severely decreased GFR   Anemia, normocytic normochromic       Imaging: Dg Ugi W/kub  02/01/2012  *RADIOLOGY REPORT*  Clinical Data:  Dysphagia  UPPER GI SERIES WITH KUB  Technique:  Routine upper GI series was performed with thin barium  Fluoroscopy Time: 1.8 minutes  Comparison:  None.  Findings: Rapid sequence spot films with the patient in the right lateral decubitus position show the swallowing mechanism to be normal.  No aspiration is seen.  However, the primary peristaltic wave of the esophagus is markedly diminished.  There are significant tertiary contractions with presbyesophagus present.  A moderate sized hiatal hernia is also noted.  The stomach is otherwise normal in configuration.  The duodenal bulb fills and the duodenal loop is in normal  position.  A moderate sized descending duodenal diverticulum is present.  No reflux is seen.  IMPRESSION:  1.  Significant  tertiary contractions with presbyesophagus. 2.  Diminished primary esophageal peristalsis. 3.  Moderate sized hiatal hernia.  No reflux.   Original Report Authenticated By: Juline Patch, M.D.      [facility] Calculation: Score not calculated

## 2012-02-05 NOTE — Patient Instructions (Addendum)
Your physician recommends that you schedule a follow-up appointment in: 3 months  Your physician has recommended you make the following change in your medication:  1 - DECREASE Metoprolol to 50 mg daily 2 - DECREASE Amiodarone to 100 mg daily  A chest x-ray takes a picture of the organs and structures inside the chest, including the heart, lungs, and blood vessels. This test can show several things, including, whether the heart is enlarges; whether fluid is building up in the lungs; and whether pacemaker / defibrillator leads are still in place.

## 2012-02-05 NOTE — Assessment & Plan Note (Signed)
Blood pressure is on the low side; the dose of metoprolol will be decreased to 50 mg per day

## 2012-02-05 NOTE — Assessment & Plan Note (Signed)
H&H of 11.6/35.5 in 12/2011 Possibly secondary to Chronic renal disease.

## 2012-02-05 NOTE — Assessment & Plan Note (Signed)
Pacemaker function appears normal without evidence for symptomatic bradyarrhythmias.

## 2012-02-09 ENCOUNTER — Other Ambulatory Visit: Payer: Self-pay | Admitting: *Deleted

## 2012-02-09 DIAGNOSIS — I10 Essential (primary) hypertension: Secondary | ICD-10-CM

## 2012-02-19 ENCOUNTER — Encounter: Payer: Self-pay | Admitting: Cardiology

## 2012-02-21 ENCOUNTER — Encounter: Payer: Self-pay | Admitting: Cardiology

## 2012-02-22 ENCOUNTER — Encounter: Payer: Self-pay | Admitting: *Deleted

## 2012-04-08 ENCOUNTER — Other Ambulatory Visit (HOSPITAL_COMMUNITY): Payer: Self-pay | Admitting: Nephrology

## 2012-04-08 DIAGNOSIS — N289 Disorder of kidney and ureter, unspecified: Secondary | ICD-10-CM

## 2012-04-09 ENCOUNTER — Telehealth: Payer: Self-pay | Admitting: Cardiology

## 2012-04-09 NOTE — Telephone Encounter (Signed)
Clarified doses of medications that were changed at October appointment.  Verbalized understanding.

## 2012-04-09 NOTE — Telephone Encounter (Signed)
WOULD LIKE NURSE TO GO OVER MEDICATION. STATES THAT THE RX PICKED UP FROM PHARMACY ARE NOT THE SAME DOSE PT NORMALLY TAKES

## 2012-04-22 ENCOUNTER — Encounter: Payer: Self-pay | Admitting: Cardiology

## 2012-04-22 ENCOUNTER — Ambulatory Visit (HOSPITAL_COMMUNITY)
Admission: RE | Admit: 2012-04-22 | Discharge: 2012-04-22 | Disposition: A | Discharge status: Home / Self Care | Payer: Medicare Other | Source: Ambulatory Visit | Attending: Nephrology | Admitting: Nephrology

## 2012-04-22 DIAGNOSIS — N289 Disorder of kidney and ureter, unspecified: Secondary | ICD-10-CM | POA: Insufficient documentation

## 2012-05-06 ENCOUNTER — Encounter: Payer: Self-pay | Admitting: Cardiology

## 2012-05-08 ENCOUNTER — Ambulatory Visit: Payer: Medicare Other | Admitting: Cardiology

## 2012-05-17 ENCOUNTER — Encounter: Payer: Self-pay | Admitting: Cardiology

## 2012-05-17 ENCOUNTER — Ambulatory Visit (INDEPENDENT_AMBULATORY_CARE_PROVIDER_SITE_OTHER): Payer: Medicare Other | Admitting: Cardiology

## 2012-05-17 VITALS — BP 132/60 | HR 60 | Ht 60.0 in | Wt 132.0 lb

## 2012-05-17 DIAGNOSIS — D649 Anemia, unspecified: Secondary | ICD-10-CM

## 2012-05-17 DIAGNOSIS — I10 Essential (primary) hypertension: Secondary | ICD-10-CM

## 2012-05-17 DIAGNOSIS — Z950 Presence of cardiac pacemaker: Secondary | ICD-10-CM

## 2012-05-17 DIAGNOSIS — N184 Chronic kidney disease, stage 4 (severe): Secondary | ICD-10-CM

## 2012-05-17 NOTE — Progress Notes (Deleted)
Name: Isabella Chen    DOB: December 13, 1921  Age: 79 y.o.  MR#: [id number]       PCP:  Michiel Sites, MD      Insurance: @PAYORNAME @   CC:   LIST   VS BP 132/60  Pulse 60  Ht 5' (1.524 m)  Wt 132 lb (59.875 kg)  BMI 25.78 kg/m2  SpO2 99%  Weights Current Weight  05/17/12 132 lb (59.875 kg)  02/05/12 137 lb 1.9 oz (62.197 kg)  12/22/11 141 lb 1.9 oz (64.012 kg)    Blood Pressure  BP Readings from Last 3 Encounters:  05/17/12 132/60  02/05/12 90/53  12/22/11 106/62     Admit date:  (Not on file) Last encounter with RMR:  04/09/2012   Allergy No Known Allergies  Current Outpatient Prescriptions  Medication Sig Dispense Refill  . amiodarone (PACERONE) 100 MG tablet Take 1 tablet (100 mg total) by mouth daily.  90 tablet  3  . fish oil-omega-3 fatty acids 1000 MG capsule Take 2 g by mouth daily.      Marland Kitchen glimepiride (AMARYL) 4 MG tablet Take 4 mg by mouth daily before breakfast.      . metoprolol succinate (TOPROL-XL) 50 MG 24 hr tablet Take 1 tablet (50 mg total) by mouth daily. Take with or immediately following a meal.  30 tablet  6  . Multiple Vitamin (MULITIVITAMIN WITH MINERALS) TABS Take 1 tablet by mouth daily.      . Multiple Vitamins-Minerals (PRESERVISION AREDS 2 PO) Take 2 capsules by mouth daily.      . pantoprazole (PROTONIX) 40 MG tablet Take 40 mg by mouth daily.      . rosuvastatin (CRESTOR) 5 MG tablet Take 5 mg by mouth daily.      Marland Kitchen torsemide (DEMADEX) 100 MG tablet Take 50 mg by mouth as needed.      . warfarin (COUMADIN) 3 MG tablet Take 1.5-3 mg by mouth every evening. MWF-0.5 half tab (1.5 mg), All other days- 1 tab (3 mg)        Discontinued Meds:   There are no discontinued medications.  Patient Active Problem List  Diagnosis  . Hypertension  . Presence of permanent cardiac pacemaker  . Diabetes mellitus, type II  . Macular degeneration syndrome  . Sick sinus syndrome  . Chronic kidney disease, stage 4, severely decreased GFR  . Anemia,  normocytic normochromic    LABS No visits with results within 3 Month(s) from this visit. Latest known visit with results is:  Orders Only on 01/26/2012  Component Date Value  . Sodium 01/26/2012 138   . Potassium 01/26/2012 5.3   . Chloride 01/26/2012 100   . CO2 01/26/2012 30   . Glucose, Bld 01/26/2012 85   . BUN 01/26/2012 45*  . Creat 01/26/2012 2.28*  . Calcium 01/26/2012 10.8*     Results for this Opt Visit:     Results for orders placed in visit on 01/26/12  BASIC METABOLIC PANEL      Component Value Range   Sodium 138  135 - 145 mEq/L   Potassium 5.3  3.5 - 5.3 mEq/L   Chloride 100  96 - 112 mEq/L   CO2 30  19 - 32 mEq/L   Glucose, Bld 85  70 - 99 mg/dL   BUN 45 (*) 6 - 23 mg/dL   Creat 4.09 (*) 8.11 - 1.10 mg/dL   Calcium 91.4 (*) 8.4 - 10.5 mg/dL    EKG Orders placed  in visit on 05/17/12  . EKG 12-LEAD     Prior Assessment and Plan Problem List as of 05/17/2012          Hypertension   Last Assessment & Plan Note   02/05/2012 Office Visit Signed 02/05/2012  4:55 PM by Kathlen Brunswick, MD    Blood pressure is on the low side; the dose of metoprolol will be decreased to 50 mg per day    Presence of permanent cardiac pacemaker   Last Assessment & Plan Note   02/05/2012 Office Visit Signed 02/05/2012  4:53 PM by Kathlen Brunswick, MD    Pacemaker function appears normal without evidence for symptomatic bradyarrhythmias.    Diabetes mellitus, type II   Macular degeneration syndrome   Sick sinus syndrome   Last Assessment & Plan Note   02/05/2012 Office Visit Signed 02/05/2012  4:53 PM by Kathlen Brunswick, MD    Patient appears to be maintaining sinus rhythm with amiodarone.  Due to her relatively small body mass, dose will be decreased to 100 mg per day.  Monitoring laboratory and chest x-ray will be obtained.  Recent TSH was normal.    Chronic kidney disease, stage 4, severely decreased GFR   Last Assessment & Plan Note   02/05/2012 Office Visit  Signed 02/05/2012  4:57 PM by Kathlen Brunswick, MD    Patient has had a fairly rapid progression of renal insufficiency.  Basic laboratory studies including a urinalysis and renal ultrasound should be undertaken if not already performed.  Referral to a nephrologist advised-Dr. Juleen China will reassess at an upcoming office visit.    Anemia, normocytic normochromic   Last Assessment & Plan Note   02/05/2012 Office Visit Signed 02/05/2012  4:55 PM by Kathlen Brunswick, MD    H&H of 11.6/35.5 in 12/2011 Possibly secondary to Chronic renal disease.        Imaging: US Renal  04/22/2012  *RADIOLOGY REPORT*  Clinical Data: Renal insufficiency.  RENAL/URINARY TRACT ULTRASOUND COMPLETE  Comparison:  None.  Findings:  Right Kidney:  10.7 cm in length.  Mild renal cortical thinning and slight increased echogenicity.  Small cysts are noted.  No hydronephrosis.  Left Kidney:  9.0 cm in length.  Renal cortical thinning and increased echogenicity suggesting medical renal disease.  No hydronephrosis.  Bladder:  Empty  IMPRESSION:  1.  Renal cortical thinning and increased echogenicity suggesting medical renal disease. 2.  No hydronephrosis.   Original Report Authenticated By: Rudie Meyer, M.D.      [facility] Calculation: Score not calculated

## 2012-05-17 NOTE — Patient Instructions (Addendum)
Your physician recommends that you schedule a follow-up appointment in: ONE YEAR   Your physician recommends that you schedule a follow-up appointment in: 3 MONTHS WITH [facility] FOR PACER CHECK  PLEASE IMPLEMENT THE RENAL DIET GIVEN WITH YOUR DISCHARGE INSTRUCTIONS PER RECOMMENDATION FROM MD RR

## 2012-05-20 NOTE — Assessment & Plan Note (Signed)
Progressive renal dysfunction over the course of the past year with borderline hyperkalemia (5.3) as of 01/2012.  Renal diet reviewed with patient.

## 2012-05-20 NOTE — Progress Notes (Signed)
Patient ID: Isabella Chen, female   DOB: 08/07/1921, 79 y.o.   MRN: [id number]  HPI: Scheduled return visit for this very nice woman with conduction system disease, having previously required placement of a permanent pacemaker, hypertension, diabetes mellitus and advanced chronic kidney disease.  Despite the significant chronic medical conditions, she does fairly well, able to get around without cardiopulmonary symptoms.  She is maintained on amiodarone to prevent recurrent atrial arrhythmias, and has had no symptoms of same.  Prior to Admission medications   Medication Sig Start Date End Date Taking? Authorizing Provider  amiodarone (PACERONE) 100 MG tablet Take 1 tablet (100 mg total) by mouth daily. 02/05/12 02/04/13 Yes Kathlen Brunswick, MD  fish oil-omega-3 fatty acids 1000 MG capsule Take 2 g by mouth daily.   Yes Historical Provider, MD  glimepiride (AMARYL) 4 MG tablet Take 4 mg by mouth daily before breakfast.   Yes Historical Provider, MD  metoprolol succinate (TOPROL-XL) 50 MG 24 hr tablet Take 1 tablet (50 mg total) by mouth daily. Take with or immediately following a meal. 02/05/12  Yes Kathlen Brunswick, MD  Multiple Vitamin (MULITIVITAMIN WITH MINERALS) TABS Take 1 tablet by mouth daily.   Yes Historical Provider, MD  Multiple Vitamins-Minerals (PRESERVISION AREDS 2 PO) Take 2 capsules by mouth daily.   Yes Historical Provider, MD  pantoprazole (PROTONIX) 40 MG tablet Take 40 mg by mouth daily.   Yes Historical Provider, MD  rosuvastatin (CRESTOR) 5 MG tablet Take 5 mg by mouth daily.   Yes Historical Provider, MD  torsemide (DEMADEX) 100 MG tablet Take 50 mg by mouth as needed. 01/12/12  Yes Kathlen Brunswick, MD  warfarin (COUMADIN) 3 MG tablet Take 1.5-3 mg by mouth every evening. MWF-0.5 half tab (1.5 mg), All other days- 1 tab (3 mg)   Yes Historical Provider, MD  No Known Allergies    Past medical history, social history, and family history reviewed and updated.  ROS: Denies  chest pain, orthopnea, PND, palpitations, lightheadedness or syncope.  All other systems reviewed and are negative.  PHYSICAL EXAM: BP 132/60  Pulse 60  Ht 5' (1.524 m)  Wt 59.875 kg (132 lb)  BMI 25.78 kg/m2  SpO2 99%   General-Well developed; no acute distress Body habitus-proportionate weight and height Neck-No JVD; no carotid bruits Lungs-clear lung fields; resonant to percussion; mild kyphosis Cardiovascular-normal PMI; normal S1 and S2; grade 2/6 nearly holosystolic murmur Abdomen-normal bowel sounds; soft and non-tender without masses or organomegaly Musculoskeletal-No deformities, no cyanosis or clubbing Neurologic-Normal cranial nerves; symmetric strength and tone Skin-Warm, no significant lesions Extremities-distal pulses intact; 1+ edema  EKG: Electronic atrial pacing at a rate of 60 bpm; left axis deviation; left bundle branch block.  No previous tracing for comparison.  ASSESSMENT AND PLAN:  [place] Bing, MD 05/20/2012 10:27 AM

## 2012-05-20 NOTE — Assessment & Plan Note (Signed)
Appointment will be scheduled for routine reassessment of pacemaker in 08/2012.

## 2012-05-20 NOTE — Assessment & Plan Note (Signed)
Blood pressure has been generally well controlled with only a few episodes of systolic hypertension to 140-160 mmHg.  Current medication will be continued.

## 2012-05-20 NOTE — Assessment & Plan Note (Signed)
Chronic anemia remained stable as of the last assessment available to me in 12/2011, and is likely secondary to chronic kidney disease.  At this level, symptoms are unlikely.

## 2012-08-20 ENCOUNTER — Ambulatory Visit (INDEPENDENT_AMBULATORY_CARE_PROVIDER_SITE_OTHER): Payer: Medicare Other | Admitting: Internal Medicine

## 2012-08-20 ENCOUNTER — Encounter: Payer: Self-pay | Admitting: Internal Medicine

## 2012-08-20 VITALS — BP 122/58 | HR 43 | Ht 60.0 in | Wt 131.0 lb

## 2012-08-20 DIAGNOSIS — I495 Sick sinus syndrome: Secondary | ICD-10-CM

## 2012-08-20 DIAGNOSIS — Z950 Presence of cardiac pacemaker: Secondary | ICD-10-CM

## 2012-08-20 DIAGNOSIS — I10 Essential (primary) hypertension: Secondary | ICD-10-CM

## 2012-08-20 LAB — PACEMAKER DEVICE OBSERVATION
AL IMPEDENCE PM: 400 Ohm
AL THRESHOLD: 0.75 V
BATTERY VOLTAGE: 2.9478 V
RV LEAD AMPLITUDE: 8.2 mv
RV LEAD IMPEDENCE PM: 437.5 Ohm

## 2012-08-20 NOTE — Patient Instructions (Addendum)
Your physician recommends that you schedule a follow-up appointment in: WITH [facility] IN ONE YEAR AND WILL HAVE A REMOTE CHECK ON November 25 2012

## 2012-08-21 ENCOUNTER — Encounter: Payer: Self-pay | Admitting: Internal Medicine

## 2012-08-21 NOTE — Progress Notes (Signed)
HPI Isabella Chen returns today for followup. She is a pleasant elderly woman with a h/o symptomatic bradycardia, s/p PPM insertion. In the interim she has done well. No syncope, chest pain or sob. Minimal palpitations. No Known Allergies   Current Outpatient Prescriptions  Medication Sig Dispense Refill  . amiodarone (PACERONE) 100 MG tablet Take 1 tablet (100 mg total) by mouth daily.  90 tablet  3  . fish oil-omega-3 fatty acids 1000 MG capsule Take 2 g by mouth daily.      Marland Kitchen glimepiride (AMARYL) 4 MG tablet Take 4 mg by mouth daily before breakfast.      . meclizine (ANTIVERT) 25 MG tablet Take 25 mg by mouth 4 (four) times daily.      . metoprolol succinate (TOPROL-XL) 50 MG 24 hr tablet Take 1 tablet (50 mg total) by mouth daily. Take with or immediately following a meal.  30 tablet  6  . Multiple Vitamins-Minerals (PRESERVISION AREDS 2 PO) Take 2 capsules by mouth daily.      . pantoprazole (PROTONIX) 40 MG tablet Take 40 mg by mouth daily.      . rosuvastatin (CRESTOR) 5 MG tablet Take 5 mg by mouth daily.      . traMADol (ULTRAM) 50 MG tablet Take 50 mg by mouth 4 (four) times daily as needed for pain.      . valsartan (DIOVAN) 160 MG tablet Take 160 mg by mouth daily.      Marland Kitchen warfarin (COUMADIN) 3 MG tablet Take 1.5-3 mg by mouth every evening. MWF-0.5 half tab (1.5 mg), All other days- 1 tab (3 mg)      . Multiple Vitamin (MULITIVITAMIN WITH MINERALS) TABS Take 1 tablet by mouth daily.      Marland Kitchen torsemide (DEMADEX) 100 MG tablet Take 50 mg by mouth as needed.       No current facility-administered medications for this visit.     Past Medical History  Diagnosis Date  . Diabetes mellitus, type II   . Macular degeneration syndrome   . Sick sinus syndrome     Atrial fibrillation + bradycardia; St. Jude dual-chamber device in 2013; normal EF + mild to moderate LVH on echocardiogram  . Hypertension   . Pulmonary hypertension     Estimated PA pressure of 50 mmHg    ROS:   All  systems reviewed and negative except as noted in the HPI.   Past Surgical History  Procedure Laterality Date  . Mastectomy    . Bowel resection    . Abdominal hysterectomy    . Ovarian cyst removal    . Tonsillectomy    . A-v cardiac pacemaker insertion  05/2011    St. Jude Accent DR dual-chamber pacemaker     No family history on file.   History   Social History  . Marital Status: Widowed    Spouse Name: N/A    Number of Children: N/A  . Years of Education: N/A   Occupational History  . Not on file.   Social History Main Topics  . Smoking status: Never Smoker   . Smokeless tobacco: Not on file  . Alcohol Use: No  . Drug Use: No  . Sexually Active: No   Other Topics Concern  . Not on file   Social History Narrative  . No narrative on file     BP 122/58  Pulse 43  Ht 5' (1.524 m)  Wt 131 lb (59.421 kg)  BMI 25.58 kg/m2  Physical  Exam:  Well appearing elderly woman, NAD HEENT: Unremarkable Neck:  7 cm JVD, no thyromegally Lungs:  Clear with no wheezes, rales, or rhonchi HEART:  Regular rate rhythm, no murmurs, no rubs, no clicks Abd:  soft, positive bowel sounds, no organomegally, no rebound, no guarding Ext:  2 plus pulses, no edema, no cyanosis, no clubbing Skin:  No rashes no nodules Neuro:  CN II through XII intact, motor grossly intact  EKG - NSR with atrial pacing and LBBB.  DEVICE  Normal device function.  See PaceArt for details.   Assess/Plan:

## 2012-08-21 NOTE — Assessment & Plan Note (Signed)
Her blood pressure is well controlled today. Continue current meds and maintain a low sodium diet.

## 2012-08-21 NOTE — Assessment & Plan Note (Signed)
Her St. Jude DDD PPM is working normally. Will recheck in several months. 

## 2012-09-10 ENCOUNTER — Encounter: Payer: Self-pay | Admitting: Internal Medicine

## 2012-11-13 ENCOUNTER — Other Ambulatory Visit: Payer: Self-pay

## 2012-11-25 ENCOUNTER — Encounter: Payer: Medicare Other | Admitting: *Deleted

## 2012-11-27 ENCOUNTER — Encounter: Payer: Self-pay | Admitting: *Deleted

## 2012-11-28 ENCOUNTER — Other Ambulatory Visit: Payer: Self-pay | Admitting: Endocrinology

## 2012-11-28 DIAGNOSIS — R1031 Right lower quadrant pain: Secondary | ICD-10-CM

## 2012-11-29 ENCOUNTER — Emergency Department (HOSPITAL_COMMUNITY)
Admission: EM | Admit: 2012-11-29 | Discharge: 2012-11-29 | Disposition: A | Discharge status: Home / Self Care | Payer: Medicare Other | Attending: Emergency Medicine | Admitting: Emergency Medicine

## 2012-11-29 ENCOUNTER — Encounter (HOSPITAL_COMMUNITY): Payer: Self-pay

## 2012-11-29 DIAGNOSIS — E119 Type 2 diabetes mellitus without complications: Secondary | ICD-10-CM | POA: Insufficient documentation

## 2012-11-29 DIAGNOSIS — Z8669 Personal history of other diseases of the nervous system and sense organs: Secondary | ICD-10-CM | POA: Insufficient documentation

## 2012-11-29 DIAGNOSIS — Z79899 Other long term (current) drug therapy: Secondary | ICD-10-CM | POA: Insufficient documentation

## 2012-11-29 DIAGNOSIS — R61 Generalized hyperhidrosis: Secondary | ICD-10-CM | POA: Insufficient documentation

## 2012-11-29 DIAGNOSIS — E1169 Type 2 diabetes mellitus with other specified complication: Secondary | ICD-10-CM | POA: Insufficient documentation

## 2012-11-29 DIAGNOSIS — Z7901 Long term (current) use of anticoagulants: Secondary | ICD-10-CM | POA: Insufficient documentation

## 2012-11-29 DIAGNOSIS — I10 Essential (primary) hypertension: Secondary | ICD-10-CM | POA: Insufficient documentation

## 2012-11-29 DIAGNOSIS — Z950 Presence of cardiac pacemaker: Secondary | ICD-10-CM | POA: Insufficient documentation

## 2012-11-29 DIAGNOSIS — Z8709 Personal history of other diseases of the respiratory system: Secondary | ICD-10-CM | POA: Insufficient documentation

## 2012-11-29 DIAGNOSIS — E162 Hypoglycemia, unspecified: Secondary | ICD-10-CM

## 2012-11-29 DIAGNOSIS — R011 Cardiac murmur, unspecified: Secondary | ICD-10-CM | POA: Insufficient documentation

## 2012-11-29 DIAGNOSIS — H02409 Unspecified ptosis of unspecified eyelid: Secondary | ICD-10-CM | POA: Insufficient documentation

## 2012-11-29 DIAGNOSIS — R259 Unspecified abnormal involuntary movements: Secondary | ICD-10-CM | POA: Insufficient documentation

## 2012-11-29 DIAGNOSIS — Z8679 Personal history of other diseases of the circulatory system: Secondary | ICD-10-CM | POA: Insufficient documentation

## 2012-11-29 LAB — GLUCOSE, CAPILLARY
Glucose-Capillary: 140 mg/dL — ABNORMAL HIGH (ref 70–99)
Glucose-Capillary: 123 mg/dL — ABNORMAL HIGH (ref 70–99)
Glucose-Capillary: 134 mg/dL — ABNORMAL HIGH (ref 70–99)

## 2012-11-29 LAB — CBC
HCT: 35.9 % — ABNORMAL LOW (ref 36.0–46.0)
Hemoglobin: 11.9 g/dL — ABNORMAL LOW (ref 12.0–15.0)
MCH: 28.2 pg (ref 26.0–34.0)
MCHC: 33.1 g/dL (ref 30.0–36.0)
MCV: 85.1 fL (ref 78.0–100.0)
Platelets: 156 10*3/uL (ref 150–400)
RBC: 4.22 MIL/uL (ref 3.87–5.11)
RDW: 16.4 % — ABNORMAL HIGH (ref 11.5–15.5)
WBC: 6 10*3/uL (ref 4.0–10.5)

## 2012-11-29 LAB — BASIC METABOLIC PANEL
BUN: 43 mg/dL — ABNORMAL HIGH (ref 6–23)
CO2: 27 mEq/L (ref 19–32)
Calcium: 10.7 mg/dL — ABNORMAL HIGH (ref 8.4–10.5)
Chloride: 97 mEq/L (ref 96–112)
Creatinine, Ser: 2.63 mg/dL — ABNORMAL HIGH (ref 0.50–1.10)
GFR calc Af Amer: 18 mL/min — ABNORMAL LOW (ref >90)
GFR calc non Af Amer: 15 mL/min — ABNORMAL LOW (ref >90)
Glucose, Bld: 131 mg/dL — ABNORMAL HIGH (ref 70–99)
Potassium: 4.2 mEq/L (ref 3.5–5.1)
Sodium: 133 mEq/L — ABNORMAL LOW (ref 135–145)

## 2012-11-29 LAB — PROTIME-INR
INR: 2.93 — ABNORMAL HIGH (ref 0.00–1.49)
Prothrombin Time: 29.5 seconds — ABNORMAL HIGH (ref 11.6–15.2)

## 2012-11-29 NOTE — ED Notes (Signed)
Ambulated pt to br with 2 person assist.

## 2012-11-29 NOTE — ED Notes (Signed)
Blood sugar was 37 when EMS arrived on scene. Gave 1 amp D50 and it came up to 272, woke up and ate 1/2 peanut butter sandwich, after 15 minutes it dropped to 208. Patient is currently being treated for a UTI started medication for UTI 2 days ago.

## 2012-11-29 NOTE — ED Provider Notes (Signed)
CSN: [id number]     Arrival date & time 11/29/12  1953 History  This chart was scribed for Isabella Razor, MD by Bennett Scrape, ED Scribe. This patient was seen in room APA19/APA19 and the patient's care was started at 8:27 PM     Chief Complaint  Patient presents with  . Hypoglycemia    The history is provided by a relative and the patient. No language interpreter was used.   HPI Comments: Isabella Chen is a 79 y.o. female who presents to the Emergency Department with hypoglycemia. Pt laid down in bed around 6 pm and started calling out for a member of the family that wasn't in room.  Pt was noted to be incessantly repeating "i'm so scared, I don't know whether I'm laying down or sitting up". Was diaphoretic. Blood sugar was 37 on EMS arrival. Symptoms improved as well as hypoglycemia after getting an amp of d50 and eating a peanut butter sandwhich. Brought to ED for further evaluation. Pt was seeen by her PCP 2 days ago and she was started on antibiotics for UTI.  On glimepiride for hx of DM. Had her typical PO intake for breakfast/lunch.  Pt's only complaint currenlty is feeling "shaky". Mental status is back to her baseline per family at bedside.    Past Medical History  Diagnosis Date  . Diabetes mellitus, type II   . Macular degeneration syndrome   . Sick sinus syndrome     Atrial fibrillation + bradycardia; St. Jude dual-chamber device in 2013; normal EF + mild to moderate LVH on echocardiogram  . Hypertension   . Pulmonary hypertension     Estimated PA pressure of 50 mmHg   Past Surgical History  Procedure Laterality Date  . Mastectomy    . Bowel resection    . Abdominal hysterectomy    . Ovarian cyst removal    . Tonsillectomy    . A-v cardiac pacemaker insertion  05/2011    St. Jude Accent DR dual-chamber pacemaker   No family history on file. History  Substance Use Topics  . Smoking status: Never Smoker   . Smokeless tobacco: Not on file  . Alcohol Use: No   No  OB history  Review of Systems  Constitutional: Negative for fever.       Positive for "shakiness"  Eyes: Negative for visual disturbance.  Respiratory: Negative for shortness of breath.   Cardiovascular: Negative for chest pain.  All other systems reviewed and are negative.    Allergies  Review of patient's allergies indicates no known allergies.  Home Medications   Current Outpatient Rx  Name  Route  Sig  Dispense  Refill  . amiodarone (PACERONE) 100 MG tablet   Oral   Take 1 tablet (100 mg total) by mouth daily.   90 tablet   3   . fish oil-omega-3 fatty acids 1000 MG capsule   Oral   Take 2 g by mouth daily.         Marland Kitchen glimepiride (AMARYL) 4 MG tablet   Oral   Take 4 mg by mouth daily before breakfast.         . meclizine (ANTIVERT) 25 MG tablet   Oral   Take 25 mg by mouth 4 (four) times daily.         . metoprolol succinate (TOPROL-XL) 50 MG 24 hr tablet   Oral   Take 1 tablet (50 mg total) by mouth daily. Take with or immediately following a meal.  30 tablet   6   . Multiple Vitamin (MULITIVITAMIN WITH MINERALS) TABS   Oral   Take 1 tablet by mouth daily.         . Multiple Vitamins-Minerals (PRESERVISION AREDS 2 PO)   Oral   Take 2 capsules by mouth daily.         . pantoprazole (PROTONIX) 40 MG tablet   Oral   Take 40 mg by mouth daily.         . rosuvastatin (CRESTOR) 5 MG tablet   Oral   Take 5 mg by mouth daily.         Marland Kitchen torsemide (DEMADEX) 100 MG tablet   Oral   Take 50 mg by mouth as needed.         . traMADol (ULTRAM) 50 MG tablet   Oral   Take 50 mg by mouth 4 (four) times daily as needed for pain.         . valsartan (DIOVAN) 160 MG tablet   Oral   Take 160 mg by mouth daily.         Marland Kitchen warfarin (COUMADIN) 3 MG tablet   Oral   Take 1.5-3 mg by mouth every evening. MWF-0.5 half tab (1.5 mg), All other days- 1 tab (3 mg)          Triage Vitals: BP 141/43  Pulse 60  Temp(Src) 97.8 F (36.6 C) (Oral)   Resp 16  Ht 5\' 1"  (1.549 m)  Wt 120 lb (54.432 kg)  BMI 22.69 kg/m2  SpO2 99%  Physical Exam  Nursing note and vitals reviewed. Constitutional: She is oriented to person, place, and time. She appears well-developed and well-nourished.  HENT:  Head: Normocephalic and atraumatic.  Eyes: Conjunctivae and EOM are normal. Pupils are equal, round, and reactive to light.  Ptosis  left eye  Neck: Normal range of motion. Neck supple.  Cardiovascular: Regular rhythm.   Murmur (soft, systolic) heard. Pulmonary/Chest: Effort normal and breath sounds normal. No respiratory distress.  Abdominal: Soft. Bowel sounds are normal. She exhibits no distension. There is no tenderness.  Musculoskeletal: Normal range of motion. She exhibits no edema.  5/5 strength in extremities  Neurological: She is alert and oriented to person, place, and time. No cranial nerve deficit. She exhibits normal muscle tone.  Speech clear. Content appropriate. Somewhat hard of hearing, CN2-12 otherwise intact.   Skin: Skin is warm and dry.    ED Course   DIAGNOSTIC STUDIES: Oxygen Saturation is 99% on room air, normal by my interpretation.    COORDINATION OF CARE: 8:14 PM-Discussed treatment plan which includes labs and monitoring for continued hypoglycemia. Possible admit for observation. Pt is agreeable. Will observe for several hours to see if glucose levels stabilize. Also informed pt that antibiotic can interfere with coumadin and DM medications.   Procedures (including critical care time)  Labs Reviewed  GLUCOSE, CAPILLARY - Abnormal; Notable for the following:    Glucose-Capillary 140 (*)    All other components within normal limits  PROTIME-INR - Abnormal; Notable for the following:    Prothrombin Time 29.5 (*)    INR 2.93 (*)    All other components within normal limits  CBC - Abnormal; Notable for the following:    Hemoglobin 11.9 (*)    HCT 35.9 (*)    RDW 16.4 (*)    All other components within normal  limits  BASIC METABOLIC PANEL - Abnormal; Notable for the following:    Sodium 133 (*)  Glucose, Bld 131 (*)    BUN 43 (*)    Creatinine, Ser 2.63 (*)    Calcium 10.7 (*)    GFR calc non Af Amer 15 (*)    GFR calc Af Amer 18 (*)    All other components within normal limits  GLUCOSE, CAPILLARY - Abnormal; Notable for the following:    Glucose-Capillary 123 (*)    All other components within normal limits  GLUCOSE, CAPILLARY - Abnormal; Notable for the following:    Glucose-Capillary 134 (*)    All other components within normal limits   No results found. 1. Hypoglycemia     MDM  89yF with hypoglycemia. On glimepiride.  Effects likely potentiated by recently starting bactrim for UTI.  Also on coumadin. INR in upper level of therapeutic range.    Despite the longish half-life of glimepiride, I feel she is safe for discharge at this point. She remained at her baseline mental status and glucose remained in acceptable range (140, 131, 123, 134) with out further intervention during 3.5 hour ED stay. L eye ptosis apparently chronic. Neuro exam otherwise nonfocal. I have a very low suspicion for other alternative etiology for her change in mental status such as CVA, tox, infectious, etc.   Pt instructed to decrease glimepiride to 2mg  daily and coumadin to 1.5mg  daily while she is trim/sulfa and then to resume previous dosing when she is finished. Discussed with multiple family members as well. Murmur noted on exam, which pt reports no prior recollection of. Per review of records, this was noted on most recent note by Dr Dietrich Pates. Regardless, non-contributory to today's event. Incidentally noted renal impairment and mild hypercalcemia but appears fairly stable from prior labs ~10 months ago. Return precautions discussed.   Isabella Razor, MD 12/05/12 [phone]

## 2012-11-29 NOTE — ED Notes (Signed)
Pt had peanut butter crackers.

## 2012-12-02 ENCOUNTER — Ambulatory Visit
Admission: RE | Admit: 2012-12-02 | Discharge: 2012-12-02 | Disposition: A | Discharge status: Home / Self Care | Payer: Medicare Other | Source: Ambulatory Visit | Attending: Endocrinology | Admitting: Endocrinology

## 2012-12-02 DIAGNOSIS — R1031 Right lower quadrant pain: Secondary | ICD-10-CM

## 2012-12-18 ENCOUNTER — Other Ambulatory Visit (HOSPITAL_COMMUNITY): Payer: Self-pay | Admitting: Nephrology

## 2012-12-18 DIAGNOSIS — E213 Hyperparathyroidism, unspecified: Secondary | ICD-10-CM

## 2012-12-30 ENCOUNTER — Encounter (HOSPITAL_COMMUNITY): Payer: Self-pay

## 2012-12-30 ENCOUNTER — Encounter (HOSPITAL_COMMUNITY)
Admission: RE | Admit: 2012-12-30 | Discharge: 2012-12-30 | Disposition: A | Discharge status: Home / Self Care | Payer: Medicare Other | Source: Ambulatory Visit | Attending: Nephrology | Admitting: Nephrology

## 2012-12-30 ENCOUNTER — Ambulatory Visit (HOSPITAL_COMMUNITY)
Admission: RE | Admit: 2012-12-30 | Discharge: 2012-12-30 | Disposition: A | Discharge status: Home / Self Care | Payer: Medicare Other | Source: Ambulatory Visit | Attending: Nephrology | Admitting: Nephrology

## 2012-12-30 DIAGNOSIS — E213 Hyperparathyroidism, unspecified: Secondary | ICD-10-CM | POA: Insufficient documentation

## 2012-12-30 DIAGNOSIS — I359 Nonrheumatic aortic valve disorder, unspecified: Secondary | ICD-10-CM | POA: Insufficient documentation

## 2012-12-30 DIAGNOSIS — I270 Primary pulmonary hypertension: Secondary | ICD-10-CM | POA: Insufficient documentation

## 2012-12-30 DIAGNOSIS — E119 Type 2 diabetes mellitus without complications: Secondary | ICD-10-CM | POA: Insufficient documentation

## 2012-12-30 DIAGNOSIS — I369 Nonrheumatic tricuspid valve disorder, unspecified: Secondary | ICD-10-CM

## 2012-12-30 DIAGNOSIS — I495 Sick sinus syndrome: Secondary | ICD-10-CM | POA: Insufficient documentation

## 2012-12-30 DIAGNOSIS — I10 Essential (primary) hypertension: Secondary | ICD-10-CM | POA: Insufficient documentation

## 2012-12-30 MED ORDER — TECHNETIUM TC 99M SESTAMIBI - CARDIOLITE
25.0000 | Freq: Once | INTRAVENOUS | Status: AC | PRN
  Administered 2012-12-30: 12:00:00 25 via INTRAVENOUS

## 2012-12-30 NOTE — Progress Notes (Signed)
*  PRELIMINARY RESULTS* Echocardiogram 2D Echocardiogram has been performed.  Isabella Chen 12/30/2012, 11:18 AM

## 2013-01-21 ENCOUNTER — Other Ambulatory Visit: Payer: Self-pay | Admitting: Urology

## 2013-01-21 ENCOUNTER — Ambulatory Visit (INDEPENDENT_AMBULATORY_CARE_PROVIDER_SITE_OTHER): Payer: Medicare Other | Admitting: Urology

## 2013-01-21 DIAGNOSIS — N1330 Unspecified hydronephrosis: Secondary | ICD-10-CM

## 2013-01-21 DIAGNOSIS — N201 Calculus of ureter: Secondary | ICD-10-CM

## 2013-01-30 ENCOUNTER — Ambulatory Visit (HOSPITAL_COMMUNITY)
Admission: RE | Admit: 2013-01-30 | Discharge: 2013-01-30 | Disposition: A | Discharge status: Home / Self Care | Payer: Medicare Other | Source: Ambulatory Visit | Attending: Urology | Admitting: Urology

## 2013-01-30 DIAGNOSIS — N1330 Unspecified hydronephrosis: Secondary | ICD-10-CM

## 2013-01-30 DIAGNOSIS — Z87442 Personal history of urinary calculi: Secondary | ICD-10-CM | POA: Insufficient documentation

## 2013-02-13 ENCOUNTER — Other Ambulatory Visit: Payer: Self-pay

## 2013-03-18 ENCOUNTER — Other Ambulatory Visit: Payer: Self-pay | Admitting: Cardiology

## 2013-07-18 ENCOUNTER — Ambulatory Visit (INDEPENDENT_AMBULATORY_CARE_PROVIDER_SITE_OTHER): Payer: Medicare Other | Admitting: Cardiology

## 2013-07-18 VITALS — BP 138/52 | HR 63 | Ht 60.0 in | Wt 124.0 lb

## 2013-07-18 DIAGNOSIS — I10 Essential (primary) hypertension: Secondary | ICD-10-CM

## 2013-07-18 DIAGNOSIS — I4891 Unspecified atrial fibrillation: Secondary | ICD-10-CM

## 2013-07-18 DIAGNOSIS — I495 Sick sinus syndrome: Secondary | ICD-10-CM

## 2013-07-18 NOTE — Patient Instructions (Addendum)
Your physician wants you to follow-up in: 6 months You will receive a reminder letter in the mail two months in advance. If you don't receive a letter, please call our office to schedule the follow-up appointment.   Your physician recommends that you continue on your current medications as directed. Please refer to the Current Medication list given to you today.    We will schedule you for a pacemaker apt also today

## 2013-07-18 NOTE — Progress Notes (Signed)
Clinical Summary Isabella Chen is a 80 y.o.female former patient of Dr Lattie Haw, this is our first visit together. He is seen for the following medical problems.   1. Afib/ Tachy-Brady syndrome -hx of tachy-brady syndrome with afib - St Jude dual chamber pacemaker placed in 2013. Normal device check 08/2012 - she is on amio - denies any palpitatoins.Denies any lightheadedness or dizziness.  - compliant with meds, including coumadin. Denies any bleeding issues. INR followed by pcp   2. HTN - compliant with meds - does not check at home  3. Hyperlipidemia - compliant with crestor - reports recent panel 1 week ago by pcp  4. CKD Followed by renal  Past Medical History  Diagnosis Date  . Diabetes mellitus, type II   . Macular degeneration syndrome   . Sick sinus syndrome     Atrial fibrillation + bradycardia; St. Jude dual-chamber device in 2013; normal EF + mild to moderate LVH on echocardiogram  . Hypertension   . Pulmonary hypertension     Estimated PA pressure of 50 mmHg  . Cancer      No Known Allergies   Current Outpatient Prescriptions  Medication Sig Dispense Refill  . amiodarone (PACERONE) 100 MG tablet TAKE 1 TABLET (100 MG TOTAL) BY MOUTH DAILY.  90 tablet  3  . fish oil-omega-3 fatty acids 1000 MG capsule Take 2 g by mouth daily.      Marland Kitchen glimepiride (AMARYL) 4 MG tablet Take 4 mg by mouth daily before breakfast.      . meclizine (ANTIVERT) 25 MG tablet Take 25 mg by mouth 4 (four) times daily.      . metoprolol succinate (TOPROL-XL) 50 MG 24 hr tablet Take 1 tablet (50 mg total) by mouth daily. Take with or immediately following a meal.  30 tablet  6  . Multiple Vitamin (MULITIVITAMIN WITH MINERALS) TABS Take 1 tablet by mouth daily.      . Multiple Vitamins-Minerals (PRESERVISION AREDS 2 PO) Take 2 capsules by mouth daily.      . pantoprazole (PROTONIX) 40 MG tablet Take 40 mg by mouth daily.      . rosuvastatin (CRESTOR) 5 MG tablet Take 5 mg by mouth  daily.      Marland Kitchen sulfamethoxazole-trimethoprim (BACTRIM DS) 800-160 MG per tablet Take 1 tablet by mouth 2 (two) times daily. Starting 11/27/2012 x 10 days.      Marland Kitchen torsemide (DEMADEX) 100 MG tablet Take 50 mg by mouth as needed.      . traMADol (ULTRAM) 50 MG tablet Take 50 mg by mouth 4 (four) times daily as needed for pain.      . valsartan (DIOVAN) 160 MG tablet Take 160 mg by mouth daily.      Marland Kitchen warfarin (COUMADIN) 3 MG tablet Take 1.5-3 mg by mouth every evening. Takes 3 mg daily except 1.5 mg on Thursdays.       No current facility-administered medications for this visit.     Past Surgical History  Procedure Laterality Date  . Mastectomy    . Bowel resection    . Abdominal hysterectomy    . Ovarian cyst removal    . Tonsillectomy    . A-v cardiac pacemaker insertion  05/2011    St. Jude Accent DR dual-chamber pacemaker     No Known Allergies    No family history on file.   Social History Ms. Prestridge reports that she has never smoked. She does not have any smokeless tobacco  history on file. Ms. Boutelle reports that she does not drink alcohol.   Review of Systems CONSTITUTIONAL: No weight loss, fever, chills, weakness or fatigue.  HEENT: Eyes: No visual loss, blurred vision, double vision or yellow sclerae.No hearing loss, sneezing, congestion, runny nose or sore throat.  SKIN: No rash or itching.  CARDIOVASCULAR: per HPI RESPIRATORY: No shortness of breath, cough or sputum.  GASTROINTESTINAL: No anorexia, nausea, vomiting or diarrhea. No abdominal pain or blood.  GENITOURINARY: No burning on urination, no polyuria NEUROLOGICAL: No headache, dizziness, syncope, paralysis, ataxia, numbness or tingling in the extremities. No change in bowel or bladder control.  MUSCULOSKELETAL: No muscle, back pain, joint pain or stiffness.  LYMPHATICS: No enlarged nodes. No history of splenectomy.  PSYCHIATRIC: No history of depression or anxiety.  ENDOCRINOLOGIC: No reports of sweating, cold  or heat intolerance. No polyuria or polydipsia.  Marland Kitchen   Physical Examination p 63 bp 138/52 Wt 124 lbs BMI 24 Gen: resting comfortably, no acute distress HEENT: no scleral icterus, pupils equal round and reactive, no palptable cervical adenopathy,  CV: RRR, 3/6 systolic murmur at apex, no JVD Resp: Clear to auscultation bilaterally GI: abdomen is soft, non-tender, non-distended, normal bowel sounds, no hepatosplenomegaly MSK: extremities are warm, no edema.  Skin: warm, no rash Neuro:  no focal deficits Psych: appropriate affect   Diagnostic Studies 12/2012 Echo LVEF 37-85%, grade I diasotlic dysfunction, mild aortic stenosis, mild AI, mod TR, mod pulm HTN (PASP 66)    Assessment and Plan  1. Afib/Tachy-brady syndrome - no current symptoms - continue amio for now, check CMET and TSH at next visit - continue coumadin  2. HTN - at goal, continue current meds  3. Hyperlipidemia - request recent panel from pcp, continue statin    F/u 6 months  Arnoldo Lenis, M.D., F.A.C.C.

## 2013-07-24 ENCOUNTER — Telehealth: Payer: Self-pay | Admitting: *Deleted

## 2013-07-24 ENCOUNTER — Telehealth: Payer: Self-pay

## 2013-07-24 ENCOUNTER — Telehealth: Payer: Self-pay | Admitting: Internal Medicine

## 2013-07-24 NOTE — Telephone Encounter (Signed)
Awaiting Prior auth papers to be faxed to office.

## 2013-07-24 NOTE — Telephone Encounter (Signed)
Please see paper in refill bin / tgs  °

## 2013-07-24 NOTE — Telephone Encounter (Signed)
Received lab work on patient from [place], placed in folder on Dr.Branch's desk for review

## 2013-07-24 NOTE — Telephone Encounter (Signed)
Prior auth faxed. Awaiting approval

## 2013-07-24 NOTE — Telephone Encounter (Signed)
Spoke pt's son, he stated the pt has ran out of amiodarone today and what the pt should do. This nurse just received prior authorization this morning. Called [facility] in [place] and pt can pick up a few weeks worth paying out of pocket for them. Pt's son made aware.

## 2013-07-25 ENCOUNTER — Telehealth: Payer: Self-pay | Admitting: *Deleted

## 2013-07-25 NOTE — Telephone Encounter (Signed)
Called and made pt aware that pt is approved for Amiodarone till Dec of 2015.

## 2013-07-31 ENCOUNTER — Encounter: Payer: Self-pay | Admitting: Cardiology

## 2013-08-06 ENCOUNTER — Other Ambulatory Visit: Payer: Self-pay | Admitting: Orthopedic Surgery

## 2013-08-06 DIAGNOSIS — R52 Pain, unspecified: Secondary | ICD-10-CM

## 2013-08-14 ENCOUNTER — Other Ambulatory Visit: Payer: Medicare Other

## 2013-08-15 ENCOUNTER — Encounter: Payer: Self-pay | Admitting: Cardiology

## 2013-08-29 ENCOUNTER — Ambulatory Visit (INDEPENDENT_AMBULATORY_CARE_PROVIDER_SITE_OTHER): Payer: Medicare Other | Admitting: Internal Medicine

## 2013-08-29 ENCOUNTER — Encounter: Payer: Self-pay | Admitting: Internal Medicine

## 2013-08-29 VITALS — BP 171/50 | HR 60 | Ht 60.0 in | Wt 123.0 lb

## 2013-08-29 DIAGNOSIS — I495 Sick sinus syndrome: Secondary | ICD-10-CM

## 2013-08-29 DIAGNOSIS — I10 Essential (primary) hypertension: Secondary | ICD-10-CM

## 2013-08-29 DIAGNOSIS — Z950 Presence of cardiac pacemaker: Secondary | ICD-10-CM

## 2013-08-29 LAB — MDC_IDC_ENUM_SESS_TYPE_INCLINIC
Battery Remaining Longevity: 110 mo
Battery Voltage: 2.95 V
Brady Statistic RV Percent Paced: 1 %
Lead Channel Impedance Value: 400 Ohm
Lead Channel Pacing Threshold Amplitude: 0.75 V
Lead Channel Sensing Intrinsic Amplitude: 3 mV
Lead Channel Setting Pacing Amplitude: 2 V
Lead Channel Setting Pacing Pulse Width: 0.4 ms
MDC IDC MSMT LEADCHNL RA PACING THRESHOLD PULSEWIDTH: 0.4 ms
MDC IDC MSMT LEADCHNL RV IMPEDANCE VALUE: 440 Ohm
MDC IDC MSMT LEADCHNL RV PACING THRESHOLD AMPLITUDE: 0.75 V
MDC IDC MSMT LEADCHNL RV PACING THRESHOLD PULSEWIDTH: 0.4 ms
MDC IDC MSMT LEADCHNL RV SENSING INTR AMPL: 8.5 mV
MDC IDC PG SERIAL: 7317892
MDC IDC SET LEADCHNL RV PACING AMPLITUDE: 1 V
MDC IDC SET LEADCHNL RV SENSING SENSITIVITY: 2 mV
MDC IDC STAT BRADY RA PERCENT PACED: 90 %

## 2013-08-29 NOTE — Assessment & Plan Note (Signed)
Her blood pressure is 150/65 on my check. I have asked the patient to reduce her sodium intake. She will continue her current meds.

## 2013-08-29 NOTE — Patient Instructions (Signed)
Your physician recommends that you schedule a follow-up appointment in: 1 year with Dr [name] will receive a reminder letter two months in advance reminding you to call and schedule your appointment. If you don't receive this letter, please contact our office.  Remote monitoring is used to monitor your Pacemaker of ICD from home. This monitoring reduces the number of office visits required to check your device to one time per year. It allows [place] to keep an eye on the functioning of your device to ensure it is working properly. You are scheduled for a device check from home on 12-01-13. You may send your transmission at any time that day. If you have a wireless device, the transmission will be sent automatically. After your physician reviews your transmission, you will receive a postcard with your next transmission date.

## 2013-08-29 NOTE — Progress Notes (Signed)
HPI Isabella Chen returns today for followup. She is a pleasant elderly woman with a h/o symptomatic bradycardia, s/p PPM insertion. In the interim she has done well except for trouble with weight loss. No syncope, chest pain or sob. Minimal palpitations. No Known Allergies   Current Outpatient Prescriptions  Medication Sig Dispense Refill  . amiodarone (PACERONE) 100 MG tablet TAKE 1 TABLET (100 MG TOTAL) BY MOUTH DAILY.  90 tablet  3  . fish oil-omega-3 fatty acids 1000 MG capsule Take 2 g by mouth daily.      Marland Kitchen glimepiride (AMARYL) 4 MG tablet Take 2 mg by mouth daily before breakfast.       . meclizine (ANTIVERT) 25 MG tablet Take 25 mg by mouth 4 (four) times daily.      . metoprolol succinate (TOPROL-XL) 50 MG 24 hr tablet Take 1 tablet (50 mg total) by mouth daily. Take with or immediately following a meal.  30 tablet  6  . Multiple Vitamin (MULITIVITAMIN WITH MINERALS) TABS Take 1 tablet by mouth daily.      . Multiple Vitamins-Minerals (PRESERVISION AREDS 2 PO) Take 2 capsules by mouth daily.      . pantoprazole (PROTONIX) 40 MG tablet Take 40 mg by mouth daily.      . rosuvastatin (CRESTOR) 5 MG tablet Take 5 mg by mouth daily.      . SENSIPAR 30 MG tablet Take 30 mg by mouth daily with breakfast.       . torsemide (DEMADEX) 100 MG tablet Take 50 mg by mouth as needed.      . traMADol (ULTRAM) 50 MG tablet Take 50 mg by mouth 4 (four) times daily as needed for pain.      . valsartan (DIOVAN) 160 MG tablet Take 160 mg by mouth daily.      Marland Kitchen warfarin (COUMADIN) 3 MG tablet Take 1.5-3 mg by mouth every evening. Takes 3 mg daily except 1.5 mg on Thursdays.       No current facility-administered medications for this visit.     Past Medical History  Diagnosis Date  . Diabetes mellitus, type II   . Macular degeneration syndrome   . Sick sinus syndrome     Atrial fibrillation + bradycardia; St. Jude dual-chamber device in 2013; normal EF + mild to moderate LVH on echocardiogram  .  Hypertension   . Pulmonary hypertension     Estimated PA pressure of 50 mmHg  . Cancer     ROS:   All systems reviewed and negative except as noted in the HPI.   Past Surgical History  Procedure Laterality Date  . Mastectomy    . Bowel resection    . Abdominal hysterectomy    . Ovarian cyst removal    . Tonsillectomy    . A-v cardiac pacemaker insertion  05/2011    St. Jude Accent DR dual-chamber pacemaker     No family history on file.   History   Social History  . Marital Status: Widowed    Spouse Name: N/A    Number of Children: N/A  . Years of Education: N/A   Occupational History  . Not on file.   Social History Main Topics  . Smoking status: Never Smoker   . Smokeless tobacco: Not on file  . Alcohol Use: No  . Drug Use: No  . Sexual Activity: No   Other Topics Concern  . Not on file   Social History Narrative  . No narrative on  file     BP 171/50  Pulse 60  Ht 5' (1.524 m)  Wt 123 lb (55.792 kg)  BMI 24.02 kg/m2  Physical Exam:  Well appearing elderly woman, NAD HEENT: Unremarkable Neck:  7 cm JVD, no thyromegally Lungs:  Clear with no wheezes, rales, or rhonchi HEART:  Regular rate rhythm, 2/6 systolic murmur consistent with MR, no rubs, no clicks Abd:  soft, positive bowel sounds, no organomegally, no rebound, no guarding Ext:  2 plus pulses, no edema, no cyanosis, no clubbing Skin:  No rashes no nodules Neuro:  CN II through XII intact, motor grossly intact   DEVICE  Normal device function.  See PaceArt for details.   Assess/Plan:

## 2013-08-29 NOTE — Assessment & Plan Note (Signed)
Her St. Jude DDD PM is working normally. Will recheck in several months.  

## 2013-12-01 ENCOUNTER — Encounter: Payer: Medicare Other | Admitting: *Deleted

## 2013-12-01 ENCOUNTER — Telehealth: Payer: Self-pay | Admitting: Cardiology

## 2013-12-01 NOTE — Telephone Encounter (Signed)
LMOVM reminding pt to send remote transmission.   

## 2013-12-02 ENCOUNTER — Encounter: Payer: Self-pay | Admitting: Cardiology

## 2014-01-18 ENCOUNTER — Emergency Department (HOSPITAL_COMMUNITY): Payer: Medicare Other

## 2014-01-18 ENCOUNTER — Observation Stay (HOSPITAL_COMMUNITY)
Admission: EM | Admit: 2014-01-18 | Discharge: 2014-01-19 | Disposition: A | Discharge status: Home / Self Care | Payer: Medicare Other | Attending: Internal Medicine | Admitting: Internal Medicine

## 2014-01-18 ENCOUNTER — Encounter (HOSPITAL_COMMUNITY): Payer: Self-pay | Admitting: Emergency Medicine

## 2014-01-18 DIAGNOSIS — G9389 Other specified disorders of brain: Secondary | ICD-10-CM | POA: Insufficient documentation

## 2014-01-18 DIAGNOSIS — E11649 Type 2 diabetes mellitus with hypoglycemia without coma: Secondary | ICD-10-CM | POA: Diagnosis not present

## 2014-01-18 DIAGNOSIS — R011 Cardiac murmur, unspecified: Secondary | ICD-10-CM | POA: Insufficient documentation

## 2014-01-18 DIAGNOSIS — S0003XA Contusion of scalp, initial encounter: Secondary | ICD-10-CM | POA: Diagnosis not present

## 2014-01-18 DIAGNOSIS — Y939 Activity, unspecified: Secondary | ICD-10-CM | POA: Diagnosis not present

## 2014-01-18 DIAGNOSIS — E860 Dehydration: Secondary | ICD-10-CM | POA: Diagnosis not present

## 2014-01-18 DIAGNOSIS — Z7901 Long term (current) use of anticoagulants: Secondary | ICD-10-CM | POA: Diagnosis not present

## 2014-01-18 DIAGNOSIS — W1830XA Fall on same level, unspecified, initial encounter: Secondary | ICD-10-CM | POA: Insufficient documentation

## 2014-01-18 DIAGNOSIS — I495 Sick sinus syndrome: Secondary | ICD-10-CM | POA: Diagnosis not present

## 2014-01-18 DIAGNOSIS — H3530 Unspecified macular degeneration: Secondary | ICD-10-CM | POA: Insufficient documentation

## 2014-01-18 DIAGNOSIS — Y929 Unspecified place or not applicable: Secondary | ICD-10-CM | POA: Diagnosis not present

## 2014-01-18 DIAGNOSIS — R4182 Altered mental status, unspecified: Secondary | ICD-10-CM | POA: Diagnosis present

## 2014-01-18 DIAGNOSIS — N184 Chronic kidney disease, stage 4 (severe): Secondary | ICD-10-CM | POA: Diagnosis not present

## 2014-01-18 DIAGNOSIS — I129 Hypertensive chronic kidney disease with stage 1 through stage 4 chronic kidney disease, or unspecified chronic kidney disease: Secondary | ICD-10-CM | POA: Diagnosis not present

## 2014-01-18 DIAGNOSIS — Z859 Personal history of malignant neoplasm, unspecified: Secondary | ICD-10-CM | POA: Diagnosis not present

## 2014-01-18 DIAGNOSIS — Z792 Long term (current) use of antibiotics: Secondary | ICD-10-CM | POA: Insufficient documentation

## 2014-01-18 DIAGNOSIS — I10 Essential (primary) hypertension: Secondary | ICD-10-CM

## 2014-01-18 DIAGNOSIS — Z950 Presence of cardiac pacemaker: Secondary | ICD-10-CM | POA: Diagnosis present

## 2014-01-18 DIAGNOSIS — Z79899 Other long term (current) drug therapy: Secondary | ICD-10-CM | POA: Insufficient documentation

## 2014-01-18 DIAGNOSIS — D385 Neoplasm of uncertain behavior of other respiratory organs: Secondary | ICD-10-CM

## 2014-01-18 DIAGNOSIS — E119 Type 2 diabetes mellitus without complications: Secondary | ICD-10-CM

## 2014-01-18 DIAGNOSIS — E162 Hypoglycemia, unspecified: Secondary | ICD-10-CM | POA: Diagnosis present

## 2014-01-18 DIAGNOSIS — W19XXXA Unspecified fall, initial encounter: Secondary | ICD-10-CM

## 2014-01-18 LAB — HEMOGLOBIN A1C
Hgb A1c MFr Bld: 7.1 % — ABNORMAL HIGH (ref <5.7)
MEAN PLASMA GLUCOSE: 157 mg/dL — AB (ref <117)

## 2014-01-18 LAB — CBC WITH DIFFERENTIAL/PLATELET
Basophils Absolute: 0 10*3/uL (ref 0.0–0.1)
Basophils Relative: 0 % (ref 0–1)
Eosinophils Absolute: 0 10*3/uL (ref 0.0–0.7)
Eosinophils Relative: 0 % (ref 0–5)
HEMATOCRIT: 36.4 % (ref 36.0–46.0)
HEMOGLOBIN: 12.2 g/dL (ref 12.0–15.0)
LYMPHS ABS: 0.4 10*3/uL — AB (ref 0.7–4.0)
LYMPHS PCT: 5 % — AB (ref 12–46)
MCH: 28.6 pg (ref 26.0–34.0)
MCHC: 33.5 g/dL (ref 30.0–36.0)
MCV: 85.2 fL (ref 78.0–100.0)
MONO ABS: 0.9 10*3/uL (ref 0.1–1.0)
MONOS PCT: 10 % (ref 3–12)
NEUTROS ABS: 7.7 10*3/uL (ref 1.7–7.7)
NEUTROS PCT: 85 % — AB (ref 43–77)
Platelets: 157 10*3/uL (ref 150–400)
RBC: 4.27 MIL/uL (ref 3.87–5.11)
RDW: 16.5 % — ABNORMAL HIGH (ref 11.5–15.5)
WBC: 9 10*3/uL (ref 4.0–10.5)

## 2014-01-18 LAB — GLUCOSE, CAPILLARY
GLUCOSE-CAPILLARY: 122 mg/dL — AB (ref 70–99)
GLUCOSE-CAPILLARY: 112 mg/dL — AB (ref 70–99)
Glucose-Capillary: 208 mg/dL — ABNORMAL HIGH (ref 70–99)

## 2014-01-18 LAB — URINALYSIS, ROUTINE W REFLEX MICROSCOPIC
Bilirubin Urine: NEGATIVE
GLUCOSE, UA: 100 mg/dL — AB
Ketones, ur: NEGATIVE mg/dL
Nitrite: NEGATIVE
PH: 5.5 (ref 5.0–8.0)
Protein, ur: 100 mg/dL — AB
SPECIFIC GRAVITY, URINE: 1.01 (ref 1.005–1.030)
Urobilinogen, UA: 0.2 mg/dL (ref 0.0–1.0)

## 2014-01-18 LAB — URINE MICROSCOPIC-ADD ON

## 2014-01-18 LAB — COMPREHENSIVE METABOLIC PANEL
ALK PHOS: 110 U/L (ref 39–117)
ALT: 25 U/L (ref 0–35)
ANION GAP: 12 (ref 5–15)
AST: 21 U/L (ref 0–37)
Albumin: 2.6 g/dL — ABNORMAL LOW (ref 3.5–5.2)
BILIRUBIN TOTAL: 0.4 mg/dL (ref 0.3–1.2)
BUN: 63 mg/dL — AB (ref 6–23)
CHLORIDE: 101 meq/L (ref 96–112)
CO2: 24 meq/L (ref 19–32)
CREATININE: 2.14 mg/dL — AB (ref 0.50–1.10)
Calcium: 9.9 mg/dL (ref 8.4–10.5)
GFR calc Af Amer: 22 mL/min — ABNORMAL LOW (ref >90)
GFR, EST NON AFRICAN AMERICAN: 19 mL/min — AB (ref >90)
Glucose, Bld: 66 mg/dL — ABNORMAL LOW (ref 70–99)
POTASSIUM: 3.9 meq/L (ref 3.7–5.3)
Sodium: 137 mEq/L (ref 137–147)
Total Protein: 6.5 g/dL (ref 6.0–8.3)

## 2014-01-18 LAB — CBG MONITORING, ED
GLUCOSE-CAPILLARY: 69 mg/dL — AB (ref 70–99)
GLUCOSE-CAPILLARY: 134 mg/dL — AB (ref 70–99)
GLUCOSE-CAPILLARY: 183 mg/dL — AB (ref 70–99)

## 2014-01-18 LAB — PROTIME-INR
INR: 3.43 — ABNORMAL HIGH (ref 0.00–1.49)
PROTHROMBIN TIME: 34.6 s — AB (ref 11.6–15.2)

## 2014-01-18 LAB — TROPONIN I: Troponin I: <0.3 ng/mL (ref <0.30)

## 2014-01-18 LAB — APTT: aPTT: 71 seconds — ABNORMAL HIGH (ref 24–37)

## 2014-01-18 MED ORDER — OCTREOTIDE ACETATE 100 MCG/ML IJ SOLN
50.0000 ug | Freq: Once | INTRAMUSCULAR | Status: AC
  Administered 2014-01-18: 50 ug via SUBCUTANEOUS
  Filled 2014-01-18: qty 0.5

## 2014-01-18 MED ORDER — NITROFURANTOIN MONOHYD MACRO 100 MG PO CAPS
100.0000 mg | ORAL_CAPSULE | Freq: Two times a day (BID) | ORAL | Status: DC
  Administered 2014-01-18: 100 mg via ORAL
  Filled 2014-01-18 (×4): qty 1

## 2014-01-18 MED ORDER — METOPROLOL SUCCINATE ER 50 MG PO TB24
50.0000 mg | ORAL_TABLET | Freq: Every day | ORAL | Status: DC
  Administered 2014-01-18 – 2014-01-19 (×2): 50 mg via ORAL
  Filled 2014-01-18 (×2): qty 1

## 2014-01-18 MED ORDER — WARFARIN SODIUM 3 MG PO TABS: 1.5000 mg | ORAL_TABLET | Freq: Every evening | ORAL | Status: DC

## 2014-01-18 MED ORDER — INSULIN ASPART 100 UNIT/ML ~~LOC~~ SOLN
0.0000 [IU] | SUBCUTANEOUS | Status: DC
  Administered 2014-01-18: 3 [IU] via SUBCUTANEOUS
  Administered 2014-01-18 – 2014-01-19 (×2): 1 [IU] via SUBCUTANEOUS

## 2014-01-18 MED ORDER — SODIUM CHLORIDE 0.9 % IV SOLN
INTRAVENOUS | Status: DC
  Administered 2014-01-18: 11:00:00 via INTRAVENOUS

## 2014-01-18 MED ORDER — WARFARIN - PHARMACIST DOSING INPATIENT: Status: DC

## 2014-01-18 MED ORDER — ONDANSETRON HCL 4 MG/2ML IJ SOLN: 4.0000 mg | Freq: Four times a day (QID) | INTRAMUSCULAR | Status: DC | PRN

## 2014-01-18 MED ORDER — CIPROFLOXACIN HCL 250 MG PO TABS
250.0000 mg | ORAL_TABLET | Freq: Every day | ORAL | Status: DC
  Administered 2014-01-19: 250 mg via ORAL
  Filled 2014-01-18: qty 1

## 2014-01-18 MED ORDER — ACETAMINOPHEN 650 MG RE SUPP: 650.0000 mg | Freq: Four times a day (QID) | RECTAL | Status: DC | PRN

## 2014-01-18 MED ORDER — ACETAMINOPHEN 325 MG PO TABS: 650.0000 mg | ORAL_TABLET | Freq: Four times a day (QID) | ORAL | Status: DC | PRN

## 2014-01-18 MED ORDER — ROSUVASTATIN CALCIUM 10 MG PO TABS
5.0000 mg | ORAL_TABLET | Freq: Every day | ORAL | Status: DC
  Administered 2014-01-19: 5 mg via ORAL
  Filled 2014-01-18: qty 0.5

## 2014-01-18 MED ORDER — MECLIZINE HCL 12.5 MG PO TABS
25.0000 mg | ORAL_TABLET | Freq: Four times a day (QID) | ORAL | Status: DC
  Administered 2014-01-18 – 2014-01-19 (×5): 25 mg via ORAL
  Filled 2014-01-18 (×5): qty 2

## 2014-01-18 MED ORDER — TRAMADOL HCL 50 MG PO TABS: 50.0000 mg | ORAL_TABLET | Freq: Four times a day (QID) | ORAL | Status: DC | PRN

## 2014-01-18 MED ORDER — ONDANSETRON HCL 4 MG PO TABS: 4.0000 mg | ORAL_TABLET | Freq: Four times a day (QID) | ORAL | Status: DC | PRN

## 2014-01-18 MED ORDER — PANTOPRAZOLE SODIUM 40 MG PO TBEC
40.0000 mg | DELAYED_RELEASE_TABLET | Freq: Every day | ORAL | Status: DC
  Administered 2014-01-18 – 2014-01-19 (×2): 40 mg via ORAL
  Filled 2014-01-18 (×2): qty 1

## 2014-01-18 MED ORDER — AMIODARONE HCL 200 MG PO TABS
100.0000 mg | ORAL_TABLET | Freq: Every day | ORAL | Status: DC
  Administered 2014-01-18 – 2014-01-19 (×2): 100 mg via ORAL
  Filled 2014-01-18 (×2): qty 1

## 2014-01-18 MED ORDER — IRBESARTAN 150 MG PO TABS
150.0000 mg | ORAL_TABLET | Freq: Every day | ORAL | Status: DC
  Administered 2014-01-18 – 2014-01-19 (×2): 150 mg via ORAL
  Filled 2014-01-18 (×2): qty 1

## 2014-01-18 NOTE — ED Notes (Signed)
EDP aware of pt's blood sugar. EDP advised to feed pt a meal tray and give her something to drink.

## 2014-01-18 NOTE — H&P (Signed)
[facility] History and Physical  Isabella Chen JFH:[id number] DOB: 03/12/1922 DOA: 01/18/2014  Referring physician: Dr. Kathrynn Humble, ER physician PCP: Dwan Bolt, MD   Chief Complaint: altered mental status, fall  HPI: Isabella Chen is a 80 y.o. female history of chronic kidney disease stage IV, diabetes on glimepiride, hypertension, history of sick sinus syndrome (atrial fibrillation and bradycardia) status post pacemaker, on anticoagulation. Patient's son reports that last week the patient was diagnosed with a urinary tract infection and started on nitrofurantoin. He reports that approximately 3-4 days ago she began having episodes of hypoglycemia. Her by mouth intake has been poor over the last 3 days. She's had recurrent episodes of hypoglycemia which usually resolve with oral glucose. During these episodes, she comes confused, diaphoretic. Yesterday EMS was called since the patient was unable to take any glucose by mouth. She received dextrose through an IV in improved her symptoms. Her sugars dropped again yesterday she suffered a fall. She likely passed out, fell onto the carpet, hit the back of her head. This morning, she was noted to be hypoglycemic again with blood sugar in the 30s. EMS was called and administered dextrose with improvement of her blood sugar. She is brought to the hospital for evaluation. Once her blood sugar corrected, her mental status had returned to baseline. 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 Patient describes a she's had double vision for many years but this usually corrects when she wears her eyeglasses. Last CT head in our system did not indicate any evidence of ethmoid sinus mass. Patient will be admitted for further evaluation of hypoglycemia.   Review of Systems:  Pertinent positives as per HPI, otherwise negative   Past Medical History  Diagnosis Date  . Diabetes  mellitus, type II   . Macular degeneration syndrome   . Sick sinus syndrome     Atrial fibrillation + bradycardia; St. Jude dual-chamber device in 2013; normal EF + mild to moderate LVH on echocardiogram  . Hypertension   . Pulmonary hypertension     Estimated PA pressure of 50 mmHg  . Cancer    Past Surgical History  Procedure Laterality Date  . Mastectomy    . Bowel resection    . Abdominal hysterectomy    . Ovarian cyst removal    . Tonsillectomy    . A-v cardiac pacemaker insertion  05/2011    St. Jude Accent DR dual-chamber pacemaker   Social History:  reports that she has never smoked. She does not have any smokeless tobacco history on file. She reports that she does not drink alcohol or use illicit drugs.  No Known Allergies  Family History: mother died of stroke, father had COPD   Prior to Admission medications   Medication Sig Start Date End Date Taking? Authorizing Provider  amiodarone (PACERONE) 100 MG tablet TAKE 1 TABLET (100 MG TOTAL) BY MOUTH DAILY. 03/18/13  Yes Evans Lance, MD  fish oil-omega-3 fatty acids 1000 MG capsule Take 2 g by mouth daily.   Yes Historical Provider, MD  glimepiride (AMARYL) 4 MG tablet Take 2 mg by mouth daily before breakfast.    Yes Historical Provider, MD  meclizine (ANTIVERT) 25 MG tablet Take 25 mg by mouth 4 (four) times daily.   Yes Historical Provider, MD  metoprolol succinate (TOPROL-XL) 50 MG 24 hr tablet Take 1 tablet (50 mg total) by mouth daily. Take with or  immediately following a meal. 02/05/12  Yes Yehuda Savannah, MD  Multiple Vitamin (MULITIVITAMIN WITH MINERALS) TABS Take 1 tablet by mouth daily.   Yes Historical Provider, MD  Multiple Vitamins-Minerals (PRESERVISION AREDS 2 PO) Take 2 capsules by mouth daily.   Yes Historical Provider, MD  nitrofurantoin, macrocrystal-monohydrate, (MACROBID) 100 MG capsule Take 100 mg by mouth 2 (two) times daily.   Yes Historical Provider, MD  pantoprazole (PROTONIX) 40 MG tablet Take  40 mg by mouth daily.   Yes Historical Provider, MD  rosuvastatin (CRESTOR) 5 MG tablet Take 5 mg by mouth daily.   Yes Historical Provider, MD  torsemide (DEMADEX) 100 MG tablet Take 50 mg by mouth as needed. 01/12/12  Yes Yehuda Savannah, MD  traMADol (ULTRAM) 50 MG tablet Take 50 mg by mouth 4 (four) times daily as needed for pain.   Yes Historical Provider, MD  valsartan (DIOVAN) 160 MG tablet Take 160 mg by mouth daily.   Yes Historical Provider, MD  warfarin (COUMADIN) 3 MG tablet Take 1.5-3 mg by mouth every evening. Takes 3 mg daily except 1.5 mg on Thursdays.   Yes Historical Provider, MD   Physical Exam: Filed Vitals:   01/18/14 0800 01/18/14 0818 01/18/14 0830 01/18/14 0900  BP: 109/75  111/51 124/71  Pulse: 73  66 62  Temp:  98 F (36.7 C)    Resp: 25  19 18   Height:      Weight:      SpO2: 96%  95% 93%    Wt Readings from Last 3 Encounters:  01/18/14 57.153 kg (126 lb)  08/29/13 55.792 kg (123 lb)  07/18/13 56.246 kg (124 lb)    General:  Appears calm and comfortable Eyes: PERRL, normal lids, irises & conjunctiva, extra ocular motion appears to be intact ENT: hard of hearing, grossly normal lips & tongue Neck: no LAD, masses or thyromegaly Cardiovascular: RRR, no m/r/g. trace LE edema. Telemetry: SR, no arrhythmias  Respiratory: CTA bilaterally, no w/r/r. Normal respiratory effort. Abdomen: soft, ntnd Skin: no rash or induration seen on limited exam Musculoskeletal: grossly normal tone BUE/BLE Psychiatric: grossly normal mood and affect, speech fluent and appropriate Neurologic: grossly non-focal.          Labs on Admission:  Basic Metabolic Panel:  Recent Labs Lab 01/18/14 0547  NA 137  K 3.9  CL 101  CO2 24  GLUCOSE 66*  BUN 63*  CREATININE 2.14*  CALCIUM 9.9   Liver Function Tests:  Recent Labs Lab 01/18/14 0547  AST 21  ALT 25  ALKPHOS 110  BILITOT 0.4  PROT 6.5  ALBUMIN 2.6*   No results found for this basename: LIPASE, AMYLASE,   in the last 168 hours No results found for this basename: AMMONIA,  in the last 168 hours CBC:  Recent Labs Lab 01/18/14 0547  WBC 9.0  NEUTROABS 7.7  HGB 12.2  HCT 36.4  MCV 85.2  PLT 157   Cardiac Enzymes:  Recent Labs Lab 01/18/14 0547  TROPONINI <0.30    BNP (last 3 results) No results found for this basename: PROBNP,  in the last 8760 hours CBG:  Recent Labs Lab 01/18/14 0542 01/18/14 0703 01/18/14 0821  GLUCAP 69* 134* 183*    Radiological Exams on Admission: Ct Head Wo Contrast  01/18/2014   CLINICAL DATA:  Patient lost balance and fell. Altered mental status ; hyperglycemia  EXAM: CT HEAD WITHOUT CONTRAST  TECHNIQUE: Contiguous axial images were obtained from the base of the  skull through the vertex without intravenous contrast.  COMPARISON:  October 19, 2009  FINDINGS: Moderate diffuse atrophy is stable. There is no intracranial mass, hemorrhage, extra-axial fluid collection, or midline shift. There is slight small vessel disease in the centra semiovale bilaterally. Gray-white compartments elsewhere appear normal. No acute appearing infarct.  There is an expansile mass arising from the left ethmoid sinus which is destroying the medial wall of the left orbit with extension of this lesion into the left orbit. This expansile mass measures 2.8 x 2.5 cm. This mass deviates the left medial rectus muscle laterally. It is difficult to ascertain whether this lesion actually invades the medial rectus muscle on the left.  The bony calvarium appears intact.  The mastoid air cells are clear.  IMPRESSION: There is a 2.8 x 2.5 cm expansile mass arising from the left anterior ethmoid air cell complex which is causing destruction of the medial left orbital wall with invasion into the left orbit. This mass either deviates or frankly invades the medial rectus muscle on the left. Differential considerations for this mass include mucocele versus malignancy; ENT evaluation with respect to this  apparently aggressive mass is certainly warranted.  There is moderate atrophy with slight periventricular small vessel disease. There is no intracranial mass. No hemorrhage, extra-axial fluid, or acute appearing infarct.  Critical Value/emergent results were called by telephone at the time of interpretation on 01/18/2014 at 7:22 am to Dr. Varney Biles , who verbally acknowledged these results.   Electronically Signed   By: Lowella Grip M.D.   On: 01/18/2014 07:22    EKG: Independently reviewed. 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 Hypoglycemia. Possibly related to oral hypoglycemic in the setting of chronic kidney disease. Will discontinue glimepiride and monitor blood sugars every 4 hours. Check hemoglobin A1c. 2. Altered mental status related to #1. Resolved 3. Chronic kidney disease. Creatinine appears to be at baseline. Continue to follow.  4. Hypertension. Continue outpatient regimen. 5. Ethmoid sinus mass. Case discussed with Dr. Constance Holster on call for ENT. This is likely an incidental/chronic finding and can be followed up as an outpatient. Will recommend that she see ENT post discharge 6. Sick sinus syndrome status post pacemaker. Continue Coumadin per pharmacy.  Code Status: full code DVT Prophylaxis: on coumadin Family Communication: discussed with son at the bedside Disposition Plan: discharge home once improved, hopefully in the next 24 hours  Time spent: 70mins  Lilyan Prete [facility] Pager [phone]

## 2014-01-18 NOTE — ED Notes (Signed)
EDP ok'd for pt to eat and drink.

## 2014-01-18 NOTE — Progress Notes (Signed)
ANTICOAGULATION CONSULT NOTE - Initial Consult  Pharmacy Consult for Coumadin Indication: atrial fibrillation  No Known Allergies  Patient Measurements: Height: 5' (152.4 cm) Weight: 127 lb 13.9 oz (58 kg) IBW/kg (Calculated) : 45.5  Vital Signs: Temp: 98.1 F (36.7 C) (10/11 1011) Temp Source: Oral (10/11 1011) BP: 148/55 mmHg (10/11 1011) Pulse Rate: 63 (10/11 1011)  Labs:  Recent Labs  01/18/14 0547  HGB 12.2  HCT 36.4  PLT 157  APTT 71*  LABPROT 34.6*  INR 3.43*  CREATININE 2.14*  TROPONINI <0.30    Estimated Creatinine Clearance: 13.9 ml/min (by C-G formula based on Cr of 2.14).   Medical History: Past Medical History  Diagnosis Date  . Diabetes mellitus, type II   . Macular degeneration syndrome   . Sick sinus syndrome     Atrial fibrillation + bradycardia; St. Jude dual-chamber device in 2013; normal EF + mild to moderate LVH on echocardiogram  . Hypertension   . Pulmonary hypertension     Estimated PA pressure of 50 mmHg  . Cancer     Medications:  Prescriptions prior to admission  Medication Sig Dispense Refill  . amiodarone (PACERONE) 100 MG tablet TAKE 1 TABLET (100 MG TOTAL) BY MOUTH DAILY.  90 tablet  3  . fish oil-omega-3 fatty acids 1000 MG capsule Take 2 g by mouth daily.      Marland Kitchen glimepiride (AMARYL) 4 MG tablet Take 2 mg by mouth daily before breakfast.       . meclizine (ANTIVERT) 25 MG tablet Take 25 mg by mouth 4 (four) times daily.      . metoprolol succinate (TOPROL-XL) 50 MG 24 hr tablet Take 1 tablet (50 mg total) by mouth daily. Take with or immediately following a meal.  30 tablet  6  . Multiple Vitamin (MULITIVITAMIN WITH MINERALS) TABS Take 1 tablet by mouth daily.      . Multiple Vitamins-Minerals (PRESERVISION AREDS 2 PO) Take 2 capsules by mouth daily.      . nitrofurantoin, macrocrystal-monohydrate, (MACROBID) 100 MG capsule Take 100 mg by mouth 2 (two) times daily.      . pantoprazole (PROTONIX) 40 MG tablet Take 40 mg by  mouth daily.      . rosuvastatin (CRESTOR) 5 MG tablet Take 5 mg by mouth daily.      Marland Kitchen torsemide (DEMADEX) 100 MG tablet Take 50 mg by mouth as needed.      . traMADol (ULTRAM) 50 MG tablet Take 50 mg by mouth 4 (four) times daily as needed for pain.      . valsartan (DIOVAN) 160 MG tablet Take 160 mg by mouth daily.      Marland Kitchen warfarin (COUMADIN) 3 MG tablet Take 3-4.5 mg by mouth every evening. Takes 3 mg daily except 1.5 tablet ( 4.5 mg) on Friday        Assessment: 80 yo F admitted with hypoglycemia.  She is on chronic Coumadin for hx Afib, sick sinus syndrome s/p pacemaker.  Home dose listed above.  INR is elevated on admission.  No bleeding noted.   Goal of Therapy:  INR 2-3   Plan:  Hold Coumadin today Daily INR  Isabella Chen, Isabella Chen 01/18/2014,11:11 AM

## 2014-01-18 NOTE — ED Notes (Signed)
Pt has been taking macrobid for a uti since Thursday and her sugar has been running low in the mornings. Pts blood glucose was 33 at home with EMS they gave her an amp of D50 and blood sugar came up to 253.

## 2014-01-18 NOTE — ED Notes (Signed)
Son also stated that pt lost her balance and fell backwards on thick carpet implying that it was a soft fall. Pt is on coumadin and was told that if pt ever fell and hit her head that she would need to be checked out. Dr. Kathrynn Humble ordered a head ct.

## 2014-01-18 NOTE — ED Provider Notes (Addendum)
CSN: [id number]     Arrival date & time 01/18/14  0505 History   First MD Initiated Contact with Patient 01/18/14 [phone]     Chief Complaint  Patient presents with  . Hypoglycemia     (Consider location/radiation/quality/duration/timing/severity/associated sxs/prior Treatment) HPI Comments: Pt comes from home with cc of low sugar. Per son, who is the primary care giver, pt was recently diagnosed with UTI and started on macrobid by PCP. Since that time (thursday), pt has continued to be weak, and her sugars have been consistently dropping.  Son realizes that blood glucose is dropping when pt appears sweaty, or confused. EMS was called to home yday morning as well. This night, pt was hallucinating, and not following commands, EMS called, CBG was 33. PT was given d50 amp and brought to the ER. CBG en route was in the 200s. Pt is aox3. She has some weakness, but denies any other complains. PT had a fall just few hours back, states she lost balance. PT on coumadin for afib. PMHX of NIDDM, HTN, HL, pacemaker for SSS.  Patient is a 80 y.o. female presenting with hypoglycemia. The history is provided by the patient and a relative.  Hypoglycemia Associated symptoms: no shortness of breath, no speech difficulty and no vomiting     Past Medical History  Diagnosis Date  . Diabetes mellitus, type II   . Macular degeneration syndrome   . Sick sinus syndrome     Atrial fibrillation + bradycardia; St. Jude dual-chamber device in 2013; normal EF + mild to moderate LVH on echocardiogram  . Hypertension   . Pulmonary hypertension     Estimated PA pressure of 50 mmHg  . Cancer    Past Surgical History  Procedure Laterality Date  . Mastectomy    . Bowel resection    . Abdominal hysterectomy    . Ovarian cyst removal    . Tonsillectomy    . A-v cardiac pacemaker insertion  05/2011    St. Jude Accent DR dual-chamber pacemaker   History reviewed. No pertinent family history. History  Substance Use  Topics  . Smoking status: Never Smoker   . Smokeless tobacco: Not on file  . Alcohol Use: No   OB History   Grav Para Term Preterm Abortions TAB SAB Ect Mult Living                 Review of Systems  Constitutional: Positive for activity change, appetite change and fatigue.  HENT: Negative for facial swelling and rhinorrhea.   Respiratory: Negative for cough, shortness of breath and wheezing.   Cardiovascular: Negative for chest pain.  Gastrointestinal: Negative for nausea, vomiting, abdominal pain, diarrhea, constipation, blood in stool and abdominal distention.  Genitourinary: Negative for hematuria and difficulty urinating.  Musculoskeletal: Negative for neck pain.  Skin: Negative for color change.  Allergic/Immunologic: Positive for immunocompromised state.  Neurological: Negative for speech difficulty.  Hematological: Bruises/bleeds easily.  Psychiatric/Behavioral: Negative for confusion.      Allergies  Review of patient's allergies indicates no known allergies.  Home Medications   Prior to Admission medications   Medication Sig Start Date End Date Taking? Authorizing Provider  amiodarone (PACERONE) 100 MG tablet TAKE 1 TABLET (100 MG TOTAL) BY MOUTH DAILY. 03/18/13  Yes Evans Lance, MD  fish oil-omega-3 fatty acids 1000 MG capsule Take 2 g by mouth daily.   Yes Historical Provider, MD  glimepiride (AMARYL) 4 MG tablet Take 2 mg by mouth daily before breakfast.  Yes Historical Provider, MD  meclizine (ANTIVERT) 25 MG tablet Take 25 mg by mouth 4 (four) times daily.   Yes Historical Provider, MD  metoprolol succinate (TOPROL-XL) 50 MG 24 hr tablet Take 1 tablet (50 mg total) by mouth daily. Take with or immediately following a meal. 02/05/12  Yes Yehuda Savannah, MD  Multiple Vitamin (MULITIVITAMIN WITH MINERALS) TABS Take 1 tablet by mouth daily.   Yes Historical Provider, MD  Multiple Vitamins-Minerals (PRESERVISION AREDS 2 PO) Take 2 capsules by mouth daily.    Yes Historical Provider, MD  nitrofurantoin, macrocrystal-monohydrate, (MACROBID) 100 MG capsule Take 100 mg by mouth 2 (two) times daily.   Yes Historical Provider, MD  pantoprazole (PROTONIX) 40 MG tablet Take 40 mg by mouth daily.   Yes Historical Provider, MD  rosuvastatin (CRESTOR) 5 MG tablet Take 5 mg by mouth daily.   Yes Historical Provider, MD  torsemide (DEMADEX) 100 MG tablet Take 50 mg by mouth as needed. 01/12/12  Yes Yehuda Savannah, MD  traMADol (ULTRAM) 50 MG tablet Take 50 mg by mouth 4 (four) times daily as needed for pain.   Yes Historical Provider, MD  valsartan (DIOVAN) 160 MG tablet Take 160 mg by mouth daily.   Yes Historical Provider, MD  warfarin (COUMADIN) 3 MG tablet Take 1.5-3 mg by mouth every evening. Takes 3 mg daily except 1.5 mg on Thursdays.   Yes Historical Provider, MD   BP 106/65  Pulse 65  Temp(Src) 98 F (36.7 C)  Resp 18  Ht 5' (1.524 m)  Wt 126 lb (57.153 kg)  BMI 24.61 kg/m2  SpO2 95% Physical Exam  Constitutional: She is oriented to person, place, and time. She appears well-developed and well-nourished.  HENT:  Head: Normocephalic and atraumatic.  Occipital hematoma  Eyes: EOM are normal. Pupils are equal, round, and reactive to light.  Neck: Neck supple. No JVD present.  Cardiovascular: Normal rate and regular rhythm.   Murmur heard. Pulmonary/Chest: Effort normal. No respiratory distress.  Abdominal: Soft. She exhibits no distension. There is no tenderness. There is no rebound and no guarding.  Neurological: She is alert and oriented to person, place, and time.  Skin: Skin is warm and dry.    ED Course  Procedures (including critical care time) Labs Review Labs Reviewed  CBC WITH DIFFERENTIAL - Abnormal; Notable for the following:    RDW 16.5 (*)    Neutrophils Relative % 85 (*)    Lymphocytes Relative 5 (*)    Lymphs Abs 0.4 (*)    All other components within normal limits  COMPREHENSIVE METABOLIC PANEL - Abnormal; Notable for  the following:    Glucose, Bld 66 (*)    BUN 63 (*)    Creatinine, Ser 2.14 (*)    Albumin 2.6 (*)    GFR calc non Af Amer 19 (*)    GFR calc Af Amer 22 (*)    All other components within normal limits  APTT - Abnormal; Notable for the following:    aPTT 71 (*)    All other components within normal limits  PROTIME-INR - Abnormal; Notable for the following:    Prothrombin Time 34.6 (*)    INR 3.43 (*)    All other components within normal limits  CBG MONITORING, ED - Abnormal; Notable for the following:    Glucose-Capillary 69 (*)    All other components within normal limits  CBG MONITORING, ED - Abnormal; Notable for the following:    Glucose-Capillary 134 (*)  All other components within normal limits  TROPONIN I  URINALYSIS, ROUTINE W REFLEX MICROSCOPIC  CBG MONITORING, ED    Imaging Review No results found.   EKG Interpretation   Date/Time:  Sunday January 18 2014 07:10:53 EDT Ventricular Rate:  71 PR Interval:  166 QRS Duration: 168 QT Interval:  453 QTC Calculation: 492 R Axis:   -28 Text Interpretation:  Sinus or ectopic atrial rhythm Left bundle branch  block unchanged EKG Confirmed by Kathrynn Humble, MD, Thelma Comp [phone]) on 01/18/2014  7:13:30 AM      MDM   Final diagnoses:  Hypoglycemia associated with type 2 diabetes mellitus  CKD (chronic kidney disease), stage 4 (severe)  Dehydration  Fall, initial encounter    Pt comes in with low blood sugar. She has niddm, on a sulfonyurea. Pt also has had recent UTI and is no macrobid. Sx started post macrobid. Hypoglycemia possible due to severe sepsis, sulfonylurea OD, poor po intake and poor sulfonylurea clearance, due to the CKD. Based on hx, the latter favored.  CBG: 33-->D50amp -->200+ --> 69, oral food started - so there is certainly problems with proper glucose regulation.  PT also had a fall on coumadin and has a scalp hematoma - CT head ordered. Will continue with macrobid.  Serial CBG checks, with  likely admission anticipated for proper glucose management. Will give octreotide if repeat CBG low.  Varney Biles, MD 01/18/14 [phone]  7:13 AM Just spoke w/ family. Pt's blood glucose have been labile for 3 days. They are concerned, as EMS has been summoned 3 times already to their home, and pt also had a fall, which they suspect could have been from low sugar. They would prefer that we admit patient and stabilize her sugar. Will give her home dose macrobid. Pt had ameal here, repeat CBG is 130. We will give subq octreotide as well, as they low sugar could be due to her sulfonylurea.  Varney Biles, MD 01/18/14 [phone]

## 2014-01-18 NOTE — ED Notes (Signed)
MD at bedside. 

## 2014-01-18 NOTE — ED Notes (Signed)
Several calls were made to ct with no answer. Pt has had an order for a head ct since 0544. Penny from xray came to get room 19 who has a chest xray that was ordered at 0553. This nurse made Welford Roche aware that Dr. Kathrynn Humble was already calling out questioning why Ms. Aina had not went over for her head ct. Room 19 was taken over instead. This nurse called Gwyndolyn Saxon in [place] about the order and me not being able to get in touch with him and the EDP was questioning me about why pt had not went over for her head ct. Welford Roche was sent right over to get this pt.

## 2014-01-19 DIAGNOSIS — E11649 Type 2 diabetes mellitus with hypoglycemia without coma: Secondary | ICD-10-CM | POA: Diagnosis not present

## 2014-01-19 LAB — CBC
HCT: 31.5 % — ABNORMAL LOW (ref 36.0–46.0)
HEMOGLOBIN: 10.3 g/dL — AB (ref 12.0–15.0)
MCH: 28.1 pg (ref 26.0–34.0)
MCHC: 32.7 g/dL (ref 30.0–36.0)
MCV: 85.8 fL (ref 78.0–100.0)
PLATELETS: 170 10*3/uL (ref 150–400)
RBC: 3.67 MIL/uL — AB (ref 3.87–5.11)
RDW: 16.6 % — ABNORMAL HIGH (ref 11.5–15.5)
WBC: 5.8 10*3/uL (ref 4.0–10.5)

## 2014-01-19 LAB — PROTIME-INR
INR: 3.13 — ABNORMAL HIGH (ref 0.00–1.49)
Prothrombin Time: 32.2 seconds — ABNORMAL HIGH (ref 11.6–15.2)

## 2014-01-19 LAB — BASIC METABOLIC PANEL
Anion gap: 11 (ref 5–15)
BUN: 59 mg/dL — ABNORMAL HIGH (ref 6–23)
CHLORIDE: 106 meq/L (ref 96–112)
CO2: 21 mEq/L (ref 19–32)
Calcium: 8.6 mg/dL (ref 8.4–10.5)
Creatinine, Ser: 1.9 mg/dL — ABNORMAL HIGH (ref 0.50–1.10)
GFR calc Af Amer: 26 mL/min — ABNORMAL LOW (ref >90)
GFR, EST NON AFRICAN AMERICAN: 22 mL/min — AB (ref >90)
Glucose, Bld: 120 mg/dL — ABNORMAL HIGH (ref 70–99)
Potassium: 4 mEq/L (ref 3.7–5.3)
SODIUM: 138 meq/L (ref 137–147)

## 2014-01-19 LAB — GLUCOSE, CAPILLARY
Glucose-Capillary: 72 mg/dL (ref 70–99)
Glucose-Capillary: 84 mg/dL (ref 70–99)
Glucose-Capillary: 106 mg/dL — ABNORMAL HIGH (ref 70–99)
Glucose-Capillary: 137 mg/dL — ABNORMAL HIGH (ref 70–99)

## 2014-01-19 MED ORDER — CIPROFLOXACIN HCL 250 MG PO TABS: 250.0000 mg | ORAL_TABLET | Freq: Every day | ORAL | Status: DC

## 2014-01-19 NOTE — Plan of Care (Signed)
Problem: Discharge Progression Outcomes Goal: Complications resolved/controlled Educated about hypoglycemic s/sx and how to control at home.

## 2014-01-19 NOTE — Discharge Planning (Signed)
Pt successfully ambulated 177ft with walker and 1:1 assist.

## 2014-01-19 NOTE — Progress Notes (Signed)
UR completed 

## 2014-01-19 NOTE — Care Management Note (Addendum)
    Page 1 of 1   01/19/2014     3:48:05 PM CARE MANAGEMENT NOTE 01/19/2014  Patient:  Isabella Chen, Isabella Chen   Account Number:  [id number]  Date Initiated:  01/19/2014  Documentation initiated by:  Theophilus Kinds  Subjective/Objective Assessment:   Pt admitted from home with hypoglycemia. Pt lives with her son who provides 24 hour to pt. Pt does have a rollator for home use. Pt is able to complete some ADl's for herself.     Action/Plan:   No discharge needs anticipated.   Anticipated DC Date:  01/19/2014   Anticipated DC Plan:  [place]  CM consult      [facility] Choice  HOME HEALTH   Choice offered to / List presented to:  C-4 Adult Children        HH arranged  HH-2 PT      [place].   Status of service:  Completed, signed off Medicare Important Message given?   (If response is "NO", the following Medicare IM given date fields will be blank) Date Medicare IM given:   Medicare IM given by:   Date Additional Medicare IM given:   Additional Medicare IM given by:    Discharge Disposition:  HOME/SELF CARE  Per UR Regulation:    If discussed at Long Length of Stay Meetings, dates discussed:    Comments:   01/19/14 [place], RN BSN CM PT recommends [facility] PT. Pts son has chosen [facility]. Romualdo Bolk of [facility] is aware and will collect the pts information from the chart. [place] services to start within 48 hours of discharge. No DME needs noted. Pts RN aware. 01/19/14 [place], RN BSN CM

## 2014-01-19 NOTE — Progress Notes (Signed)
[place] for Coumadin Indication: atrial fibrillation  No Known Allergies  Patient Measurements: Height: 5' (152.4 cm) Weight: 127 lb 13.9 oz (58 kg) IBW/kg (Calculated) : 45.5  Vital Signs: Temp: 98.6 F (37 C) (10/12 0559) Temp Source: Oral (10/12 0559) BP: 154/72 mmHg (10/12 0942) Pulse Rate: 73 (10/12 0559)  Labs:  Recent Labs  01/18/14 0547 01/19/14 0540  HGB 12.2 10.3*  HCT 36.4 31.5*  PLT 157 170  APTT 71*  --   LABPROT 34.6* 32.2*  INR 3.43* 3.13*  CREATININE 2.14* 1.90*  TROPONINI <0.30  --     Estimated Creatinine Clearance: 15.7 ml/min (by C-G formula based on Cr of 1.9).   Medical History: Past Medical History  Diagnosis Date  . Diabetes mellitus, type II   . Macular degeneration syndrome   . Sick sinus syndrome     Atrial fibrillation + bradycardia; [employer] Jude dual-chamber device in 2013; normal EF + mild to moderate LVH on echocardiogram  . Hypertension   . Pulmonary hypertension     Estimated PA pressure of 50 mmHg  . Cancer     Medications:  Prescriptions prior to admission  Medication Sig Dispense Refill  . amiodarone (PACERONE) 100 MG tablet TAKE 1 TABLET (100 MG TOTAL) BY MOUTH DAILY.  90 tablet  3  . fish oil-omega-3 fatty acids 1000 MG capsule Take 2 g by mouth daily.      Marland Kitchen glimepiride (AMARYL) 4 MG tablet Take 2 mg by mouth daily before breakfast.       . meclizine (ANTIVERT) 25 MG tablet Take 25 mg by mouth 4 (four) times daily.      . metoprolol succinate (TOPROL-XL) 50 MG 24 hr tablet Take 1 tablet (50 mg total) by mouth daily. Take with or immediately following a meal.  30 tablet  6  . Multiple Vitamin (MULITIVITAMIN WITH MINERALS) TABS Take 1 tablet by mouth daily.      . Multiple Vitamins-Minerals (PRESERVISION AREDS 2 PO) Take 2 capsules by mouth daily.      . nitrofurantoin, macrocrystal-monohydrate, (MACROBID) 100 MG capsule Take 100 mg by mouth 2 (two) times daily.      . pantoprazole  (PROTONIX) 40 MG tablet Take 40 mg by mouth daily.      . rosuvastatin (CRESTOR) 5 MG tablet Take 5 mg by mouth daily.      Marland Kitchen torsemide (DEMADEX) 100 MG tablet Take 50 mg by mouth as needed.      . traMADol (ULTRAM) 50 MG tablet Take 50 mg by mouth 4 (four) times daily as needed for pain.      . valsartan (DIOVAN) 160 MG tablet Take 160 mg by mouth daily.      Marland Kitchen warfarin (COUMADIN) 3 MG tablet Take 3-4.5 mg by mouth every evening. Takes 3 mg daily except 1.5 tablet ( 4.5 mg) on Friday        Assessment: 80 yo F admitted with hypoglycemia.  She is on chronic Coumadin for hx Afib, sick sinus syndrome s/p pacemaker.  Home dose listed above.  INR is elevated on admission & remains elevated today despite holding Coumadin.  No bleeding noted.   Goal of Therapy:  INR 2-3   Plan:  Hold Coumadin today Daily INR  Malaka Ruffner, Lavonia Drafts 01/19/2014,10:21 AM

## 2014-01-19 NOTE — Evaluation (Signed)
Physical Therapy Evaluation Patient Details Name: Isabella Chen MRN: [id number] DOB: 10/26/1921 Today's Date: 01/19/2014   History of Present Illness  Isabella Chen is a 80 y.o. female history of chronic kidney disease stage IV, diabetes on glimepiride, hypertension, history of sick sinus syndrome (atrial fibrillation and bradycardia) status post pacemaker, on anticoagulation. Patient's son reports that last week the patient was diagnosed with a urinary tract infection and started on nitrofurantoin. He reports that approximately 3-4 days ago she began having episodes of hypoglycemia. Her by mouth intake has been poor over the last 3 days. She's had recurrent episodes of hypoglycemia which usually resolve with oral glucose. During these episodes, she comes confused, diaphoretic. Yesterday EMS was called since the patient was unable to take any glucose by mouth. She received dextrose through an IV in improved her symptoms. Her sugars dropped again yesterday she suffered a fall. She likely passed out, fell onto the carpet, hit the back of her head. This morning, she was noted to be hypoglycemic again with blood sugar in the 30s. EMS was called and administered dextrose with improvement of her blood sugar. She is brought to the hospital for evaluation. Once her blood sugar corrected, her mental status had returned to baseline. 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 Patient describes a she's had double vision for many years but this usually corrects when she wears her eyeglasses. Last CT head in our system did not indicate any evidence of ethmoid sinus mass. Patient will be admitted for further evaluation of hypoglycemia.  Clinical Impression  Pt is a 80 year old female who presents to PT for assessment of functional mobility skills.  Pt up in chair prior to PT visit and bed mobility not assessed.  During evaluation, pt required min  assist and use of RW for transfers, and min guard/supervision and use of RW for gait of 80 feet.  Noted decreased step length and foot clearance (B) and decreased cadence which places pt at a higher fall risk.  Recommend continued PT with HHPT to address gait deficits and safe functional mobility in the home.  Pt to be d/c from acute PT services.     Follow Up Recommendations Home health PT    Equipment Recommendations  None recommended by PT       Precautions / Restrictions Precautions Precautions: Fall Restrictions Weight Bearing Restrictions: No      Mobility  Bed Mobility               General bed mobility comments: Not assessed as pt up in chair prior to PT evaluation.   Transfers Overall transfer level: Needs assistance Equipment used: Rolling walker (2 wheeled) Transfers: Sit to/from Stand Sit to Stand: Min assist         General transfer comment: Poor immediate standing balance secondary to posterior lean.   Ambulation/Gait Ambulation/Gait assistance: Supervision;Min guard Ambulation Distance (Feet): 180 Feet Assistive device: Rolling walker (2 wheeled) Gait Pattern/deviations: Step-through pattern;Decreased dorsiflexion - right;Decreased dorsiflexion - left;Decreased stride length   Gait velocity interpretation: Below normal speed for age/gender General Gait Details: Decreased step length and foot clearance (B); pt/family report decreased amb cadence from normal gait in the home.       Balance Overall balance assessment: Needs assistance Sitting-balance support: Feet supported Sitting balance-Leahy Scale: Normal     Standing balance support: Bilateral upper extremity supported;During functional activity Standing balance-Leahy Scale:  Good                               Pertinent Vitals/Pain Pain Assessment: No/denies pain    Home Living Family/patient expects to be discharged to:: Private residence Living Arrangements: Children  (Lives with son and daughter in law) Available Help at Discharge: Family;Available 24 hours/day Type of Home: House Home Access: Stairs to enter Entrance Stairs-Rails: Can reach both Entrance Stairs-Number of Steps: 4 Home Layout: One level Home Equipment: Walker - 4 wheels;Cane - single point;Bedside commode      Prior Function Level of Independence: Independent with assistive device(s)         Comments: Per pt/family, pt was mod (I) with bed mobility skills, transfers, and household amb with use of rollator.  Pt does not use the tub shower, but reports she is (I) with sponge bathing.         Extremity/Trunk Assessment               Lower Extremity Assessment: Generalized weakness         Communication   Communication: No difficulties  Cognition Arousal/Alertness: Awake/alert Behavior During Therapy: WFL for tasks assessed/performed Overall Cognitive Status: Within Functional Limits for tasks assessed                       Assessment/Plan    PT Assessment All further PT needs can be met in the next venue of care  PT Diagnosis Difficulty walking;Generalized weakness   PT Problem List Decreased strength;Decreased mobility;Decreased activity tolerance  PT Treatment Interventions     PT Goals (Current goals can be found in the Care Plan section) Acute Rehab PT Goals PT Goal Formulation: No goals set, d/c therapy     End of Session Equipment Utilized During Treatment: Gait belt Activity Tolerance: Patient limited by fatigue Patient left: in chair      Functional Limitation: Mobility: Walking and moving around Mobility: Walking and Moving Around Current Status (H0016): At least 1 percent but less than 20 percent impaired, limited or restricted Mobility: Walking and Moving Around Goal Status [phone]): 0 percent impaired, limited or restricted Mobility: Walking and Moving Around Discharge Status [phone]): At least 1 percent but less than 20 percent  impaired, limited or restricted    Time: 1457-1511 PT Time Calculation (min): 14 min   Charges:   PT Evaluation $Initial PT Evaluation Tier I: 1 Procedure     PT G Codes:     Functional Limitation: Mobility: Walking and moving around    [facility] 01/19/2014, 3:22 PM

## 2014-01-19 NOTE — Discharge Summary (Signed)
This patient was admitted with recurrent hypoglycemia and associated altered mental status. She was found to be on oral hypoglycemic. She reports being on this medication for many years. He is also has chronic kidney disease. Her oral agent was discontinued and she was monitored. Blood sugars remained stable. She did not have any further episodes of altered mental status. Incidentally, she was found to have a mass arising from the left ethmoid sinus extending into the left orbit on CT head. This was discussed with ENT on call Dr. Constance Holster and recommendations were for outpatient followup. Family has requested to followup with an ENT specialist in [place]. She had been set up with Dr. Benjamine Mola. She has been asked to follow up with her primary care physician regarding further management of her diabetes. She is otherwise stable for discharge  Isabella Chen

## 2014-01-19 NOTE — Discharge Planning (Signed)
Pt stated she was  Ready to be [facility] and she had no pain.  Pt IV removed and pt/family educated on future s/sx of hypoglycemia and ways to avoid from occuring.  Pt also set up with [facility] to help with PT strengthening.  Pt will be wheeled to car by PCT and family once ready.

## 2014-01-19 NOTE — Discharge Summary (Signed)
Physician Discharge Summary  KEAYRA GRAHAM BJS:[id number] DOB: [birth year] DOA: 01/18/2014  PCP: Dwan Bolt, MD  Admit date: 01/18/2014 Discharge date: 01/19/2014  Time spent: 40 minutes  Recommendations for Outpatient Follow-up:  1. Follow up with PCP 1 week for evaluation of DM control as oral agents stopped in setting of CKD. 2. 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 female history of chronic kidney disease stage IV, diabetes on glimepiride, hypertension, history of sick sinus syndrome (atrial fibrillation and bradycardia) status post pacemaker, on anticoagulation. Patient's son reported that the week prior to presentation on 01/18/14 the patient was diagnosed with a urinary tract infection and started on nitrofurantoin. He reported that approximately 3-4 days prior she began having episodes of hypoglycemia. Her by mouth intake had been poor over the prevouis 3 days. She'd had recurrent episodes of hypoglycemia which usually resolved with oral glucose. During these episodes, she became confused, diaphoretic.  EMS was called on 01/17/14 since the patient was unable to take any glucose by mouth. She received dextrose through an IV in improved her symptoms. Her sugars dropped again and she suffered a fall. She likely passed out, fell onto the carpet, hit the back of her head. On the morning of presentation she was noted to  be hypoglycemic again with blood sugar in the 30s. EMS was called and administered dextrose with improvement of her blood sugar. She was brought to the hospital for evaluation. Once her blood sugar corrected, her mental status had returned to baseline. 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 Patient described that she'd had double vision for many years but this usually corrects when she wears her eyeglasses. Last CT head in our system did not indicate any evidence of ethmoid sinus mass. Patient will be admitted for further evaluation of hypoglycemia Hospital Course:   Hypoglycemia. likley related to oral hypoglycemic in the setting of chronic kidney disease. Glimepiride discontinued and hypoglycemia resolved. Check hemoglobin A1c 7.1. Will discontinue oral agents at discharge. Recommend close OP follow up for optimal DM control in setting of CKd. 1. Altered mental status related to #1. Resolved 2. Chronic kidney disease. Creatinine baseline appears to be 2.0. At discharge creatinine 1.9.  3. Hypertension. Controlled.  4. Ethmoid sinus mass. Case discussed with Dr. Constance Holster on call for ENT who opined this is likely an incidental/chronic finding and can be followed up as an outpatient. [facility] as above. Sick sinus syndrome status post pacemaker. Continue Coumadin per pharmacy.     Procedures:  none  Consultations:  Dr Constance Holster phone ENT  Discharge Exam: Filed Vitals:   01/19/14 1400  BP: 94/77  Pulse: 68  Temp: 98.8 F (37.1 C)  Resp: 18    General: well nourished NAD Cardiovascular: RRR +murmur no gallup  or rub no LE edema Respiratory: normal effort BS clear bilaterally no wheeze  Discharge Instructions You were cared for by a hospitalist during your hospital stay. If you have any questions about your discharge medications or the care you received while you were in the hospital after you  are discharged, you can call the unit and asked to speak with the hospitalist on call if the hospitalist that took care of you is not available. Once you are discharged, your primary care physician will handle any further medical issues. Please note that NO REFILLS for any discharge medications will be authorized once you are discharged, as it is imperative that you return to your primary care physician (or establish a relationship with a primary care physician if you do not have one) for your aftercare needs so that they can reassess your need for medications and monitor your lab values.  Discharge Instructions   Call MD for:  extreme fatigue    Complete by:  As directed      Call MD for:  persistant dizziness or light-headedness    Complete by:  As directed      Diet - low sodium heart healthy    Complete by:  As directed      Increase activity slowly    Complete by:  As directed           Current Discharge Medication List    START taking these medications   Details  ciprofloxacin (CIPRO) 250 MG tablet Take 1 tablet (250 mg total) by mouth daily with breakfast. Qty: 3 tablet, Refills: 0      CONTINUE these medications which have NOT CHANGED   Details  amiodarone (PACERONE) 100 MG tablet TAKE 1 TABLET (100 MG TOTAL) BY MOUTH DAILY. Qty: 90 tablet, Refills: 3    fish oil-omega-3 fatty acids 1000 MG capsule Take 2 g by mouth daily.    meclizine (ANTIVERT) 25 MG tablet Take 25 mg by mouth 4 (four) times daily.    metoprolol succinate (TOPROL-XL) 50 MG 24 hr tablet Take 1 tablet (50 mg total) by mouth daily. Take with or immediately following a meal. Qty: 30 tablet, Refills: 6    Multiple Vitamin (MULITIVITAMIN WITH MINERALS) TABS Take 1 tablet by mouth daily.    Multiple Vitamins-Minerals (PRESERVISION AREDS 2 PO) Take 2 capsules by mouth daily.    pantoprazole (PROTONIX) 40 MG tablet Take 40 mg by mouth daily.    rosuvastatin (CRESTOR) 5 MG tablet Take 5 mg by mouth daily.     torsemide (DEMADEX) 100 MG tablet Take 50 mg by mouth as needed.    traMADol (ULTRAM) 50 MG tablet Take 50 mg by mouth 4 (four) times daily as needed for pain.    valsartan (DIOVAN) 160 MG tablet Take 160 mg by mouth daily.   Associated Diagnoses: Hypertension    warfarin (COUMADIN) 3 MG tablet Take 3-4.5 mg by mouth every evening. Takes 3 mg daily except 1.5 tablet ( 4.5 mg) on Friday      STOP taking these medications     glimepiride (AMARYL) 4 MG tablet      nitrofurantoin, macrocrystal-monohydrate, (MACROBID) 100 MG capsule        No Known Allergies Follow-up Information   Follow up with Dwan Bolt, MD. Schedule an appointment as soon as possible for a visit in 1 week. (recommend bmet to evaluate kidney function. follow CBG's as oral agents stopped during this hospitalization)    Specialty:  Endocrinology   Contact  information:   [place] [address] [phone]       Follow up with Ascencion Dike, MD On 02/05/2014. (appointment at 2:30pm follow up on mass in sinuus)    Specialty:  Otolaryngology   Contact information:   [address] [place] [address] [phone]       Follow up with [facility].   Contact information:   [address] [place] [address] [phone]        The results of significant diagnostics from this hospitalization (including imaging, microbiology, ancillary and laboratory) are listed below for reference.    Significant Diagnostic Studies: Ct Head Wo Contrast  01/18/2014   CLINICAL DATA:  Patient lost balance and fell. Altered mental status ; hyperglycemia  EXAM: CT HEAD WITHOUT CONTRAST  TECHNIQUE: Contiguous axial images were obtained from the base of the skull through the vertex without intravenous contrast.  COMPARISON:  October 19, 2009  FINDINGS: Moderate diffuse atrophy is stable. There is no intracranial mass, hemorrhage, extra-axial fluid collection, or  midline shift. There is slight small vessel disease in the centra semiovale bilaterally. Gray-white compartments elsewhere appear normal. No acute appearing infarct.  There is an expansile mass arising from the left ethmoid sinus which is destroying the medial wall of the left orbit with extension of this lesion into the left orbit. This expansile mass measures 2.8 x 2.5 cm. This mass deviates the left medial rectus muscle laterally. It is difficult to ascertain whether this lesion actually invades the medial rectus muscle on the left.  The bony calvarium appears intact.  The mastoid air cells are clear.  IMPRESSION: There is a 2.8 x 2.5 cm expansile mass arising from the left anterior ethmoid air cell complex which is causing destruction of the medial left orbital wall with invasion into the left orbit. This mass either deviates or frankly invades the medial rectus muscle on the left. Differential considerations for this mass include mucocele versus malignancy; ENT evaluation with respect to this apparently aggressive mass is certainly warranted.  There is moderate atrophy with slight periventricular small vessel disease. There is no intracranial mass. No hemorrhage, extra-axial fluid, or acute appearing infarct.  Critical Value/emergent results were called by telephone at the time of interpretation on 01/18/2014 at 7:22 am to Dr. Varney Biles , who verbally acknowledged these results.   Electronically Signed   By: Lowella Grip M.D.   On: 01/18/2014 07:22    Microbiology: No results found for this or any previous visit (from the past 240 hour(s)).   Labs: Basic Metabolic Panel:  Recent Labs Lab 01/18/14 0547 01/19/14 0540  NA 137 138  K 3.9 4.0  CL 101 106  CO2 24 21  GLUCOSE 66* 120*  BUN 63* 59*  CREATININE 2.14* 1.90*  CALCIUM 9.9 8.6   Liver Function Tests:  Recent Labs Lab 01/18/14 0547  AST 21  ALT 25  ALKPHOS 110  BILITOT 0.4  PROT 6.5  ALBUMIN 2.6*   No results found  for this basename: LIPASE, AMYLASE,  in the last 168 hours No results found for this basename: AMMONIA,  in the last 168 hours CBC:  Recent Labs Lab 01/18/14 0547 01/19/14 0540  WBC 9.0 5.8  NEUTROABS 7.7  --   HGB 12.2 10.3*  HCT 36.4 31.5*  MCV 85.2 85.8  PLT 157 170   Cardiac Enzymes:  Recent Labs Lab 01/18/14 0547  TROPONINI <0.30   BNP: BNP (  last 3 results) No results found for this basename: PROBNP,  in the last 8760 hours CBG:  Recent Labs Lab 01/18/14 2022 01/18/14 2359 01/19/14 0409 01/19/14 0729 01/19/14 1200  GLUCAP 112* 84 72 106* 137*       Signed:  Yonatan Guitron M  [facility] 01/19/2014, 3:33 PM

## 2014-01-21 ENCOUNTER — Encounter: Payer: Self-pay | Admitting: Cardiology

## 2014-02-05 ENCOUNTER — Ambulatory Visit (INDEPENDENT_AMBULATORY_CARE_PROVIDER_SITE_OTHER): Payer: Medicare Other | Admitting: Otolaryngology

## 2014-02-05 DIAGNOSIS — D385 Neoplasm of uncertain behavior of other respiratory organs: Secondary | ICD-10-CM

## 2014-03-09 ENCOUNTER — Other Ambulatory Visit: Payer: Self-pay | Admitting: Internal Medicine

## 2014-03-18 ENCOUNTER — Other Ambulatory Visit: Payer: Self-pay | Admitting: Internal Medicine

## 2014-03-19 ENCOUNTER — Encounter (HOSPITAL_COMMUNITY): Payer: Self-pay | Admitting: Internal Medicine

## 2014-09-11 ENCOUNTER — Encounter: Payer: Self-pay | Admitting: *Deleted

## 2014-10-02 ENCOUNTER — Emergency Department (HOSPITAL_COMMUNITY): Payer: Medicare Other

## 2014-10-02 ENCOUNTER — Emergency Department (HOSPITAL_COMMUNITY)
Admission: EM | Admit: 2014-10-02 | Discharge: 2014-10-02 | Disposition: A | Discharge status: Home / Self Care | Payer: Medicare Other | Attending: Emergency Medicine | Admitting: Emergency Medicine

## 2014-10-02 ENCOUNTER — Encounter (HOSPITAL_COMMUNITY): Payer: Self-pay

## 2014-10-02 ENCOUNTER — Other Ambulatory Visit: Payer: Self-pay

## 2014-10-02 DIAGNOSIS — E119 Type 2 diabetes mellitus without complications: Secondary | ICD-10-CM | POA: Insufficient documentation

## 2014-10-02 DIAGNOSIS — Z792 Long term (current) use of antibiotics: Secondary | ICD-10-CM | POA: Insufficient documentation

## 2014-10-02 DIAGNOSIS — R079 Chest pain, unspecified: Secondary | ICD-10-CM | POA: Diagnosis present

## 2014-10-02 DIAGNOSIS — R011 Cardiac murmur, unspecified: Secondary | ICD-10-CM | POA: Diagnosis not present

## 2014-10-02 DIAGNOSIS — Z7901 Long term (current) use of anticoagulants: Secondary | ICD-10-CM | POA: Diagnosis not present

## 2014-10-02 DIAGNOSIS — I10 Essential (primary) hypertension: Secondary | ICD-10-CM | POA: Diagnosis not present

## 2014-10-02 DIAGNOSIS — Z79899 Other long term (current) drug therapy: Secondary | ICD-10-CM | POA: Insufficient documentation

## 2014-10-02 DIAGNOSIS — Z8669 Personal history of other diseases of the nervous system and sense organs: Secondary | ICD-10-CM | POA: Insufficient documentation

## 2014-10-02 DIAGNOSIS — Z859 Personal history of malignant neoplasm, unspecified: Secondary | ICD-10-CM | POA: Insufficient documentation

## 2014-10-02 LAB — COMPREHENSIVE METABOLIC PANEL
ALBUMIN: 3.2 g/dL — AB (ref 3.5–5.0)
ALT: 24 U/L (ref 14–54)
AST: 29 U/L (ref 15–41)
Alkaline Phosphatase: 78 U/L (ref 38–126)
Anion gap: 10 (ref 5–15)
BUN: 48 mg/dL — ABNORMAL HIGH (ref 6–20)
CALCIUM: 11 mg/dL — AB (ref 8.9–10.3)
CO2: 26 mmol/L (ref 22–32)
CREATININE: 1.72 mg/dL — AB (ref 0.44–1.00)
Chloride: 101 mmol/L (ref 101–111)
GFR calc Af Amer: 29 mL/min — ABNORMAL LOW (ref >60)
GFR, EST NON AFRICAN AMERICAN: 25 mL/min — AB (ref >60)
Glucose, Bld: 237 mg/dL — ABNORMAL HIGH (ref 65–99)
Potassium: 4.2 mmol/L (ref 3.5–5.1)
Sodium: 137 mmol/L (ref 135–145)
Total Bilirubin: 0.7 mg/dL (ref 0.3–1.2)
Total Protein: 6.6 g/dL (ref 6.5–8.1)

## 2014-10-02 LAB — CBC WITH DIFFERENTIAL/PLATELET
Basophils Absolute: 0 10*3/uL (ref 0.0–0.1)
Basophils Relative: 0 % (ref 0–1)
EOS ABS: 0.1 10*3/uL (ref 0.0–0.7)
Eosinophils Relative: 3 % (ref 0–5)
HCT: 35.5 % — ABNORMAL LOW (ref 36.0–46.0)
HEMOGLOBIN: 11.6 g/dL — AB (ref 12.0–15.0)
LYMPHS ABS: 0.9 10*3/uL (ref 0.7–4.0)
LYMPHS PCT: 29 % (ref 12–46)
MCH: 28.5 pg (ref 26.0–34.0)
MCHC: 32.7 g/dL (ref 30.0–36.0)
MCV: 87.2 fL (ref 78.0–100.0)
MONOS PCT: 14 % — AB (ref 3–12)
Monocytes Absolute: 0.4 10*3/uL (ref 0.1–1.0)
NEUTROS PCT: 54 % (ref 43–77)
Neutro Abs: 1.6 10*3/uL — ABNORMAL LOW (ref 1.7–7.7)
Platelets: 155 10*3/uL (ref 150–400)
RBC: 4.07 MIL/uL (ref 3.87–5.11)
RDW: 16.3 % — ABNORMAL HIGH (ref 11.5–15.5)
WBC: 3 10*3/uL — ABNORMAL LOW (ref 4.0–10.5)

## 2014-10-02 LAB — TROPONIN I: Troponin I: <0.03 ng/mL (ref <0.031)

## 2014-10-02 LAB — PROTIME-INR
INR: 1.9 — AB (ref 0.00–1.49)
Prothrombin Time: 21.7 seconds — ABNORMAL HIGH (ref 11.6–15.2)

## 2014-10-02 LAB — LIPASE, BLOOD: Lipase: 24 U/L (ref 22–51)

## 2014-10-02 MED ORDER — ASPIRIN 325 MG PO TABS
325.0000 mg | ORAL_TABLET | Freq: Once | ORAL | Status: DC
  Filled 2014-10-02: qty 1

## 2014-10-02 MED ORDER — GI COCKTAIL ~~LOC~~
30.0000 mL | Freq: Once | ORAL | Status: AC
  Administered 2014-10-02: 30 mL via ORAL
  Filled 2014-10-02: qty 30

## 2014-10-02 NOTE — ED Provider Notes (Signed)
CSN: [id number]     Arrival date & time 10/02/14  0036 History  This chart was scribed for Jola Schmidt, MD by Rayfield Citizen, ED Scribe. This patient was seen in room APA02/APA02 and the patient's care was started at 12:52 AM.    Chief Complaint  Patient presents with  . Chest Pain   The history is provided by the patient and a relative. No language interpreter was used.     HPI Comments: AUDRENA TALAGA is a 81 y.o. female with past medical history of DM2, HTN, a-v cardiac pacemaker who presents to the Emergency Department complaining of intermittent mid-sternal chest pain, described as "warmth" radiating out toward both arms that is exacerbated with deep breathing. This pain began around 21:00 and before improving; patient's son notes another episode of similar symptoms lasting about an hour before improving again. Patient complains only of mild chest discomfort and "warmth" at present. She denies SOB, nausea, vomiting. She denies prior experience with similar symptoms. She denies prior experience with cardiac issues. Hx of pacer placement for sick sinus syndrome  Cardiologist Dr. Lattie Haw  Past Medical History  Diagnosis Date  . Diabetes mellitus, type II   . Macular degeneration syndrome   . Sick sinus syndrome     Atrial fibrillation + bradycardia; St. Jude dual-chamber device in 2013; normal EF + mild to moderate LVH on echocardiogram  . Hypertension   . Pulmonary hypertension     Estimated PA pressure of 50 mmHg  . Cancer    Past Surgical History  Procedure Laterality Date  . Mastectomy    . Bowel resection    . Abdominal hysterectomy    . Ovarian cyst removal    . Tonsillectomy    . A-v cardiac pacemaker insertion  05/2011    St. Jude Accent DR dual-chamber pacemaker  . Permanent pacemaker insertion N/A 05/17/2011    Procedure: PERMANENT PACEMAKER INSERTION;  Surgeon: Evans Lance, MD;  Location: [facility] CATH LAB;  Service: Cardiovascular;  Laterality: N/A;   No family history on  file. History  Substance Use Topics  . Smoking status: Never Smoker   . Smokeless tobacco: Not on file  . Alcohol Use: No   OB History    No data available     Review of Systems  A complete 10 system review of systems was obtained and all systems are negative except as noted in the HPI and PMH.    Allergies  Review of patient's allergies indicates no known allergies.  Home Medications   Prior to Admission medications   Medication Sig Start Date End Date Taking? Authorizing Provider  amiodarone (PACERONE) 100 MG tablet TAKE 1 TABLET (100 MG TOTAL) BY MOUTH DAILY. 03/10/14  Yes Evans Lance, MD  fish oil-omega-3 fatty acids 1000 MG capsule Take 2 g by mouth daily.   Yes Historical Provider, MD  metoprolol succinate (TOPROL-XL) 50 MG 24 hr tablet Take 1 tablet (50 mg total) by mouth daily. Take with or immediately following a meal. 02/05/12  Yes Yehuda Savannah, MD  Multiple Vitamin (MULITIVITAMIN WITH MINERALS) TABS Take 1 tablet by mouth daily.   Yes Historical Provider, MD  pantoprazole (PROTONIX) 40 MG tablet Take 40 mg by mouth daily.   Yes Historical Provider, MD  rosuvastatin (CRESTOR) 5 MG tablet Take 5 mg by mouth daily.   Yes Historical Provider, MD  torsemide (DEMADEX) 100 MG tablet Take 50 mg by mouth as needed. 01/12/12  Yes Yehuda Savannah, MD  valsartan (DIOVAN) 160 MG tablet Take 160 mg by mouth daily.   Yes Historical Provider, MD  warfarin (COUMADIN) 3 MG tablet Take 3-4.5 mg by mouth every evening. Takes 3 mg daily except 1.5 tablet ( 4.5 mg) on Friday   Yes Historical Provider, MD  ciprofloxacin (CIPRO) 250 MG tablet Take 1 tablet (250 mg total) by mouth daily with breakfast. 01/19/14   Kathie Dike, MD  meclizine (ANTIVERT) 25 MG tablet Take 25 mg by mouth 4 (four) times daily.    Historical Provider, MD  Multiple Vitamins-Minerals (PRESERVISION AREDS 2 PO) Take 2 capsules by mouth daily.    Historical Provider, MD  traMADol (ULTRAM) 50 MG tablet Take 50 mg  by mouth 4 (four) times daily as needed for pain.    Historical Provider, MD   BP 145/74 mmHg  Pulse 64  Temp(Src) 98.3 F (36.8 C) (Oral)  Resp 20  Ht 5' (1.524 m)  Wt 140 lb (63.504 kg)  BMI 27.34 kg/m2  SpO2 95% Physical Exam  Constitutional: She is oriented to person, place, and time. She appears well-developed and well-nourished. No distress.  HENT:  Head: Normocephalic and atraumatic.  Mouth/Throat: Oropharynx is clear and moist. No oropharyngeal exudate.  Moist mucous membranes  Eyes: EOM are normal. Pupils are equal, round, and reactive to light.  Neck: Normal range of motion. Neck supple. No JVD present.  Cardiovascular: Normal rate and regular rhythm.  Exam reveals no gallop and no friction rub.   Murmur (Systolic ejection murmur) heard. Pulmonary/Chest: Effort normal and breath sounds normal. No respiratory distress. She has no wheezes. She has no rales.  Abdominal: Soft. Bowel sounds are normal. She exhibits no mass. There is no tenderness. There is no rebound and no guarding.  Musculoskeletal: Normal range of motion. She exhibits no edema.  Moves all extremities normally.   Lymphadenopathy:    She has no cervical adenopathy.  Neurological: She is alert and oriented to person, place, and time. She displays normal reflexes.  Skin: Skin is warm and dry. No rash noted.  Psychiatric: She has a normal mood and affect. Her behavior is normal.  Nursing note and vitals reviewed.   ED Course  Procedures   DIAGNOSTIC STUDIES: Oxygen Saturation is 95% on RA, adequate by my interpretation.    COORDINATION OF CARE: 1:01 AM Discussed treatment plan with pt at bedside and pt agreed to plan.   Labs Review Labs Reviewed  CBC WITH DIFFERENTIAL/PLATELET - Abnormal; Notable for the following:    WBC 3.0 (*)    Hemoglobin 11.6 (*)    HCT 35.5 (*)    RDW 16.3 (*)    Neutro Abs 1.6 (*)    Monocytes Relative 14 (*)    All other components within normal limits  COMPREHENSIVE  METABOLIC PANEL - Abnormal; Notable for the following:    Glucose, Bld 237 (*)    BUN 48 (*)    Creatinine, Ser 1.72 (*)    Calcium 11.0 (*)    Albumin 3.2 (*)    GFR calc non Af Amer 25 (*)    GFR calc Af Amer 29 (*)    All other components within normal limits  PROTIME-INR - Abnormal; Notable for the following:    Prothrombin Time 21.7 (*)    INR 1.90 (*)    All other components within normal limits  TROPONIN I  LIPASE, BLOOD  TROPONIN I  TROPONIN I    Imaging Review Dg Chest 2 View  10/02/2014  CLINICAL DATA:  Acute onset of mid sternal chest pain and warmth radiating toward both arms. Chronic cough. Initial encounter.  EXAM: CHEST  2 VIEW  COMPARISON:  Chest radiograph performed 05/18/2011  FINDINGS: The lungs are well-aerated. Minimal left basilar atelectasis or scarring is again noted. There is no evidence of pleural effusion or pneumothorax.  The heart is mildly enlarged. A pacemaker is noted at the left chest wall, with leads ending at the right atrium and right ventricle. No acute osseous abnormalities are seen. There is chronic superior subluxation of both humeral heads, reflecting chronic rotator cuff tears.  IMPRESSION: Mild cardiomegaly; minimal left basilar atelectasis or scarring noted.   Electronically Signed   By: Garald Balding M.D.   On: 10/02/2014 02:13  I personally reviewed the imaging tests through PACS system I reviewed available ER/hospitalization records through the EMR   EKG Interpretation #1 Date/Time:  Friday October 02 2014 00:47:55 EDT Ventricular Rate:  62 PR Interval:  198 QRS Duration: 172 QT Interval:  462 QTC Calculation: 469 R Axis:   -49 Text Interpretation:  Sinus rhythm Left bundle branch block Baseline  wander in lead(s) V2 V3 No significant change was found Confirmed by  Haakon Titsworth  MD, Lennette Bihari ([id number]) on 10/02/2014 12:51:45 AM  ECG interpretation #2  Date: 10/02/2014  Rate: 60  Rhythm: normal sinus rhythm  QRS Axis: normal  Intervals:  normal  ST/T Wave abnormalities: normal  Conduction Disutrbances: none  Narrative Interpretation:   Old EKG Reviewed: No significant changes noted         MDM   Final diagnoses:  Chest pain, unspecified chest pain type   Low suspicion for ACS and PE.  EKG 2 and troponin 2 are negative.  Plan will be discharged home with primary care follow-up.  This may represent esophageal dysmotility/gastroesophageal reflux disease.  Patient is overall well-appearing her symptomatically this time.  Patient understands to return to the ER for new or worsening symptoms   I personally performed the services described in this documentation, which was scribed in my presence. The recorded information has been reviewed and is accurate.        Jola Schmidt, MD 10/02/14 [phone]

## 2014-10-02 NOTE — Discharge Instructions (Signed)

## 2014-10-02 NOTE — ED Notes (Signed)
Pt states this evening she had an episode of midsternal chest pain that radiated to both arms.  Pt states then when she took a deep breath the pain worsened.  Pt denies pain at this time

## 2014-10-05 ENCOUNTER — Other Ambulatory Visit: Payer: Self-pay

## 2014-10-09 ENCOUNTER — Encounter: Payer: Self-pay | Admitting: *Deleted

## 2014-11-20 ENCOUNTER — Encounter (HOSPITAL_COMMUNITY): Payer: Self-pay | Admitting: *Deleted

## 2014-11-20 ENCOUNTER — Emergency Department (INDEPENDENT_AMBULATORY_CARE_PROVIDER_SITE_OTHER)
Admission: EM | Admit: 2014-11-20 | Discharge: 2014-11-20 | Disposition: A | Discharge status: Home / Self Care | Payer: Medicare Other | Source: Home / Self Care | Attending: Family Medicine | Admitting: Family Medicine

## 2014-11-20 DIAGNOSIS — L308 Other specified dermatitis: Principal | ICD-10-CM

## 2014-11-20 DIAGNOSIS — B028 Zoster with other complications: Secondary | ICD-10-CM | POA: Diagnosis not present

## 2014-11-20 MED ORDER — VALACYCLOVIR HCL 1 G PO TABS: 1000.0000 mg | ORAL_TABLET | Freq: Three times a day (TID) | ORAL | Status: DC

## 2014-11-20 NOTE — ED Provider Notes (Signed)
CSN: [id number]     Arrival date & time 11/20/14  1309 History   None    Chief Complaint  Patient presents with  . Rash   (Consider location/radiation/quality/duration/timing/severity/associated sxs/prior Treatment) Patient is a 81 y.o. female presenting with rash. The history is provided by the patient and a relative.  Rash Location:  Torso Torso rash location:  Abd LLQ Quality: blistering, dryness, painful and redness   Pain details:    Severity:  Mild   Onset quality:  Gradual   Duration:  2 days   Progression:  Worsening Severity:  Moderate Progression:  Spreading Context: not sick contacts   Ineffective treatments:  Antibiotic cream Associated symptoms: no abdominal pain and no fever     Past Medical History  Diagnosis Date  . Diabetes mellitus, type II   . Macular degeneration syndrome   . Sick sinus syndrome     Atrial fibrillation + bradycardia; St. Jude dual-chamber device in 2013; normal EF + mild to moderate LVH on echocardiogram  . Hypertension   . Pulmonary hypertension     Estimated PA pressure of 50 mmHg  . Cancer    Past Surgical History  Procedure Laterality Date  . Mastectomy    . Bowel resection    . Abdominal hysterectomy    . Ovarian cyst removal    . Tonsillectomy    . A-v cardiac pacemaker insertion  05/2011    St. Jude Accent DR dual-chamber pacemaker  . Permanent pacemaker insertion N/A 05/17/2011    Procedure: PERMANENT PACEMAKER INSERTION;  Surgeon: Evans Lance, MD;  Location: [facility] CATH LAB;  Service: Cardiovascular;  Laterality: N/A;   History reviewed. No pertinent family history. Social History  Substance Use Topics  . Smoking status: Never Smoker   . Smokeless tobacco: None  . Alcohol Use: No   OB History    No data available     Review of Systems  Constitutional: Negative.  Negative for fever.  Gastrointestinal: Negative.  Negative for abdominal pain.  Skin: Positive for rash.    Allergies  Review of patient's allergies  indicates no known allergies.  Home Medications   Prior to Admission medications   Medication Sig Start Date End Date Taking? Authorizing Provider  amiodarone (PACERONE) 100 MG tablet TAKE 1 TABLET (100 MG TOTAL) BY MOUTH DAILY. 03/10/14   Evans Lance, MD  ciprofloxacin (CIPRO) 250 MG tablet Take 1 tablet (250 mg total) by mouth daily with breakfast. 01/19/14   Kathie Dike, MD  fish oil-omega-3 fatty acids 1000 MG capsule Take 2 g by mouth daily.    Historical Provider, MD  meclizine (ANTIVERT) 25 MG tablet Take 25 mg by mouth 4 (four) times daily.    Historical Provider, MD  metoprolol succinate (TOPROL-XL) 50 MG 24 hr tablet Take 1 tablet (50 mg total) by mouth daily. Take with or immediately following a meal. 02/05/12   Yehuda Savannah, MD  Multiple Vitamin (MULITIVITAMIN WITH MINERALS) TABS Take 1 tablet by mouth daily.    Historical Provider, MD  Multiple Vitamins-Minerals (PRESERVISION AREDS 2 PO) Take 2 capsules by mouth daily.    Historical Provider, MD  pantoprazole (PROTONIX) 40 MG tablet Take 40 mg by mouth daily.    Historical Provider, MD  rosuvastatin (CRESTOR) 5 MG tablet Take 5 mg by mouth daily.    Historical Provider, MD  torsemide (DEMADEX) 100 MG tablet Take 50 mg by mouth as needed. 01/12/12   Yehuda Savannah, MD  traMADol Veatrice Bourbon) 50  MG tablet Take 50 mg by mouth 4 (four) times daily as needed for pain.    Historical Provider, MD  valACYclovir (VALTREX) 1000 MG tablet Take 1 tablet (1,000 mg total) by mouth 3 (three) times daily. 11/20/14   Billy Fischer, MD  valsartan (DIOVAN) 160 MG tablet Take 160 mg by mouth daily.    Historical Provider, MD  warfarin (COUMADIN) 3 MG tablet Take 3-4.5 mg by mouth every evening. Takes 3 mg daily except 1.5 tablet ( 4.5 mg) on Friday    Historical Provider, MD   BP 129/70 mmHg  Pulse 59  Temp(Src) 98 F (36.7 C) (Oral)  Resp 16  SpO2 95% Physical Exam  Constitutional: She is oriented to person, place, and time. She appears  well-developed and well-nourished. No distress.  Abdominal: Soft. Bowel sounds are normal. There is no tenderness.  Neurological: She is alert and oriented to person, place, and time.  Skin: Skin is warm and dry. Rash noted. There is erythema.  Erythematous based patchy vesicular rash to left groin /abd with extension to midline post, tender, no infection.  Nursing note and vitals reviewed.   ED Course  Procedures (including critical care time) Labs Review Labs Reviewed - No data to display  Imaging Review No results found.   MDM   1. Herpes zoster dermatitis        Billy Fischer, MD 11/20/14 [phone]

## 2014-11-20 NOTE — ED Notes (Signed)
Pt  Has  Symptoms       Of      painfull  Rash   Around  Trunk          That  Started    yest

## 2014-12-10 ENCOUNTER — Ambulatory Visit (INDEPENDENT_AMBULATORY_CARE_PROVIDER_SITE_OTHER): Payer: Medicare Other | Admitting: Internal Medicine

## 2014-12-10 ENCOUNTER — Encounter: Payer: Self-pay | Admitting: Internal Medicine

## 2014-12-10 VITALS — BP 118/66 | HR 60 | Ht 60.0 in | Wt 137.0 lb

## 2014-12-10 DIAGNOSIS — I4891 Unspecified atrial fibrillation: Secondary | ICD-10-CM | POA: Insufficient documentation

## 2014-12-10 DIAGNOSIS — I10 Essential (primary) hypertension: Secondary | ICD-10-CM | POA: Diagnosis not present

## 2014-12-10 DIAGNOSIS — I340 Nonrheumatic mitral (valve) insufficiency: Secondary | ICD-10-CM

## 2014-12-10 DIAGNOSIS — I480 Paroxysmal atrial fibrillation: Secondary | ICD-10-CM

## 2014-12-10 DIAGNOSIS — Z950 Presence of cardiac pacemaker: Secondary | ICD-10-CM | POA: Diagnosis not present

## 2014-12-10 DIAGNOSIS — I495 Sick sinus syndrome: Secondary | ICD-10-CM | POA: Diagnosis not present

## 2014-12-10 LAB — CUP PACEART INCLINIC DEVICE CHECK
Battery Remaining Longevity: 104.4 mo
Brady Statistic RV Percent Paced: 0.3 %
Lead Channel Impedance Value: 462.5 Ohm
Lead Channel Pacing Threshold Amplitude: 0.75 V
Lead Channel Pacing Threshold Amplitude: 1 V
Lead Channel Pacing Threshold Pulse Width: 0.4 ms
Lead Channel Sensing Intrinsic Amplitude: 9.3 mV
Lead Channel Setting Pacing Pulse Width: 0.4 ms
MDC IDC MSMT BATTERY VOLTAGE: 2.93 V
MDC IDC MSMT LEADCHNL RA IMPEDANCE VALUE: 412.5 Ohm
MDC IDC MSMT LEADCHNL RA PACING THRESHOLD AMPLITUDE: 1 V
MDC IDC MSMT LEADCHNL RA PACING THRESHOLD PULSEWIDTH: 0.4 ms
MDC IDC MSMT LEADCHNL RA SENSING INTR AMPL: 2.1 mV
MDC IDC MSMT LEADCHNL RV PACING THRESHOLD PULSEWIDTH: 0.4 ms
MDC IDC SESS DTM: 20160901152024
MDC IDC SET LEADCHNL RA PACING AMPLITUDE: 2 V
MDC IDC SET LEADCHNL RV PACING AMPLITUDE: 1 V
MDC IDC SET LEADCHNL RV SENSING SENSITIVITY: 2 mV
MDC IDC STAT BRADY RA PERCENT PACED: 82 %
Pulse Gen Model: 2210
Pulse Gen Serial Number: 7317892

## 2014-12-10 NOTE — Assessment & Plan Note (Signed)
Her heart is maintaining NSR. She will continue her current meds.

## 2014-12-10 NOTE — Progress Notes (Signed)
HPI Isabella Chen returns today for followup. She is a pleasant elderly woman with a h/o symptomatic bradycardia, s/p PPM insertion. In the interim she has done well except for trouble with weight loss. No syncope, chest pain or sob. Minimal palpitations. She notes shingles involving the inguinal area and buttocks but she is improving. Allergies  Allergen Reactions  . Other     Muscle relaxer says it made her" fall in the floor" "took my legs away"     Current Outpatient Prescriptions  Medication Sig Dispense Refill  . amiodarone (PACERONE) 100 MG tablet TAKE 1 TABLET (100 MG TOTAL) BY MOUTH DAILY. 90 tablet 3  . ciprofloxacin (CIPRO) 250 MG tablet Take 1 tablet (250 mg total) by mouth daily with breakfast. 3 tablet 0  . fish oil-omega-3 fatty acids 1000 MG capsule Take 2 g by mouth daily.    . meclizine (ANTIVERT) 25 MG tablet Take 25 mg by mouth 4 (four) times daily.    . metoprolol succinate (TOPROL-XL) 50 MG 24 hr tablet Take 1 tablet (50 mg total) by mouth daily. Take with or immediately following a meal. 30 tablet 6  . Multiple Vitamin (MULITIVITAMIN WITH MINERALS) TABS Take 1 tablet by mouth daily.    . Multiple Vitamins-Minerals (PRESERVISION AREDS 2 PO) Take 2 capsules by mouth daily.    . pantoprazole (PROTONIX) 40 MG tablet Take 40 mg by mouth daily.    . rosuvastatin (CRESTOR) 5 MG tablet Take 5 mg by mouth daily.    Marland Kitchen torsemide (DEMADEX) 100 MG tablet Take 50 mg by mouth as needed.    . traMADol (ULTRAM) 50 MG tablet Take 50 mg by mouth 4 (four) times daily as needed for pain.    . valACYclovir (VALTREX) 1000 MG tablet Take 1 tablet (1,000 mg total) by mouth 3 (three) times daily. 21 tablet 0  . valsartan (DIOVAN) 160 MG tablet Take 160 mg by mouth daily.    Marland Kitchen warfarin (COUMADIN) 3 MG tablet Take 3-4.5 mg by mouth every evening. Takes 3 mg daily except 1.5 tablet ( 4.5 mg) on Friday     No current facility-administered medications for this visit.     Past Medical History   Diagnosis Date  . Diabetes mellitus, type II   . Macular degeneration syndrome   . Sick sinus syndrome     Atrial fibrillation + bradycardia; St. Jude dual-chamber device in 2013; normal EF + mild to moderate LVH on echocardiogram  . Hypertension   . Pulmonary hypertension     Estimated PA pressure of 50 mmHg  . Cancer     ROS:   All systems reviewed and negative except as noted in the HPI.   Past Surgical History  Procedure Laterality Date  . Mastectomy    . Bowel resection    . Abdominal hysterectomy    . Ovarian cyst removal    . Tonsillectomy    . A-v cardiac pacemaker insertion  05/2011    St. Jude Accent DR dual-chamber pacemaker  . Permanent pacemaker insertion N/A 05/17/2011    Procedure: PERMANENT PACEMAKER INSERTION;  Surgeon: Evans Lance, MD;  Location: [place] CATH LAB;  Service: Cardiovascular;  Laterality: N/A;     No family history on file.   Social History   Social History  . Marital Status: Widowed    Spouse Name: N/A  . Number of Children: N/A  . Years of Education: N/A   Occupational History  . Not on file.   Social History  Main Topics  . Smoking status: Never Smoker   . Smokeless tobacco: Never Used  . Alcohol Use: No  . Drug Use: No  . Sexual Activity: No   Other Topics Concern  . Not on file   Social History Narrative     BP 118/66 mmHg  Pulse 60  Ht 5' (1.524 m)  Wt 137 lb (62.143 kg)  BMI 26.76 kg/m2  SpO2 98%  Physical Exam:  Well appearing elderly woman, NAD HEENT: Unremarkable Neck:  7 cm JVD, no thyromegally Lungs:  Clear with no wheezes, rales, or rhonchi HEART:  Regular rate rhythm, 3/6 systolic murmur consistent with MR, no rubs, no clicks Abd:  soft, positive bowel sounds, no organomegally, no rebound, no guarding Ext:  2 plus pulses, no edema, no cyanosis, no clubbing Skin:  No rashes no nodules Neuro:  CN II through XII intact, motor grossly intact   DEVICE  Normal device function.  See PaceArt for details.    Assess/Plan:

## 2014-12-10 NOTE — Patient Instructions (Signed)
Your physician wants you to follow-up in: 12 months with Dr. Lovena Le. You will receive a reminder letter in the mail two months in advance. If you don't receive a letter, please call our office to schedule the follow-up appointment.  Remote monitoring is used to monitor your Pacemaker of ICD from home. This monitoring reduces the number of office visits required to check your device to one time per year. It allows [place] to keep an eye on the functioning of your device to ensure it is working properly. You are scheduled for a device check from home on 03/11/15. You may send your transmission at any time that day. If you have a wireless device, the transmission will be sent automatically. After your physician reviews your transmission, you will receive a postcard with your next transmission date.  Your physician recommends that you continue on your current medications as directed. Please refer to the Current Medication list given to you today.  Thank you for choosing [facility]!

## 2014-12-10 NOTE — Assessment & Plan Note (Signed)
Her murmur is a bit louder but she is mostly asymptomatic. She is strongly encouraged to reduce her sodium intake.

## 2014-12-10 NOTE — Assessment & Plan Note (Signed)
Her St. Jude DDD PM is working normally. Will recheck in several months.  

## 2014-12-10 NOTE — Assessment & Plan Note (Signed)
Her blood pressure is well controlled. Will follow.  

## 2014-12-23 ENCOUNTER — Encounter: Payer: Self-pay | Admitting: Internal Medicine

## 2015-02-02 ENCOUNTER — Other Ambulatory Visit (HOSPITAL_COMMUNITY): Payer: Self-pay | Admitting: Endocrinology

## 2015-02-02 ENCOUNTER — Ambulatory Visit (HOSPITAL_COMMUNITY)
Admission: RE | Admit: 2015-02-02 | Discharge: 2015-02-02 | Disposition: A | Discharge status: Home / Self Care | Payer: Medicare Other | Source: Ambulatory Visit | Attending: Endocrinology | Admitting: Endocrinology

## 2015-02-02 DIAGNOSIS — R609 Edema, unspecified: Secondary | ICD-10-CM

## 2015-02-02 DIAGNOSIS — M7989 Other specified soft tissue disorders: Secondary | ICD-10-CM | POA: Diagnosis not present

## 2015-02-10 ENCOUNTER — Inpatient Hospital Stay (HOSPITAL_COMMUNITY)
Admission: EM | Admit: 2015-02-10 | Discharge: 2015-02-14 | DRG: 291 | Disposition: A | Discharge status: Home / Self Care | Payer: Medicare Other | Attending: Internal Medicine | Admitting: Internal Medicine

## 2015-02-10 ENCOUNTER — Encounter (HOSPITAL_COMMUNITY): Payer: Self-pay | Admitting: Family Medicine

## 2015-02-10 ENCOUNTER — Emergency Department (HOSPITAL_COMMUNITY): Payer: Medicare Other

## 2015-02-10 DIAGNOSIS — N184 Chronic kidney disease, stage 4 (severe): Secondary | ICD-10-CM | POA: Diagnosis not present

## 2015-02-10 DIAGNOSIS — E119 Type 2 diabetes mellitus without complications: Secondary | ICD-10-CM

## 2015-02-10 DIAGNOSIS — I130 Hypertensive heart and chronic kidney disease with heart failure and stage 1 through stage 4 chronic kidney disease, or unspecified chronic kidney disease: Secondary | ICD-10-CM | POA: Diagnosis not present

## 2015-02-10 DIAGNOSIS — Z6827 Body mass index (BMI) 27.0-27.9, adult: Secondary | ICD-10-CM

## 2015-02-10 DIAGNOSIS — H3530 Unspecified macular degeneration: Secondary | ICD-10-CM | POA: Diagnosis present

## 2015-02-10 DIAGNOSIS — I10 Essential (primary) hypertension: Secondary | ICD-10-CM | POA: Diagnosis present

## 2015-02-10 DIAGNOSIS — R791 Abnormal coagulation profile: Secondary | ICD-10-CM | POA: Diagnosis present

## 2015-02-10 DIAGNOSIS — I509 Heart failure, unspecified: Secondary | ICD-10-CM

## 2015-02-10 DIAGNOSIS — R0602 Shortness of breath: Secondary | ICD-10-CM | POA: Diagnosis not present

## 2015-02-10 DIAGNOSIS — I5033 Acute on chronic diastolic (congestive) heart failure: Secondary | ICD-10-CM | POA: Insufficient documentation

## 2015-02-10 DIAGNOSIS — L8990 Pressure ulcer of unspecified site, unspecified stage: Secondary | ICD-10-CM | POA: Insufficient documentation

## 2015-02-10 DIAGNOSIS — R601 Generalized edema: Secondary | ICD-10-CM | POA: Diagnosis present

## 2015-02-10 DIAGNOSIS — E785 Hyperlipidemia, unspecified: Secondary | ICD-10-CM | POA: Diagnosis present

## 2015-02-10 DIAGNOSIS — E46 Unspecified protein-calorie malnutrition: Secondary | ICD-10-CM | POA: Diagnosis present

## 2015-02-10 DIAGNOSIS — I4891 Unspecified atrial fibrillation: Secondary | ICD-10-CM | POA: Diagnosis present

## 2015-02-10 DIAGNOSIS — I480 Paroxysmal atrial fibrillation: Secondary | ICD-10-CM

## 2015-02-10 DIAGNOSIS — Z7901 Long term (current) use of anticoagulants: Secondary | ICD-10-CM

## 2015-02-10 DIAGNOSIS — I482 Chronic atrial fibrillation: Secondary | ICD-10-CM | POA: Diagnosis present

## 2015-02-10 DIAGNOSIS — I272 Other secondary pulmonary hypertension: Secondary | ICD-10-CM | POA: Diagnosis present

## 2015-02-10 DIAGNOSIS — Z79899 Other long term (current) drug therapy: Secondary | ICD-10-CM

## 2015-02-10 DIAGNOSIS — E1122 Type 2 diabetes mellitus with diabetic chronic kidney disease: Secondary | ICD-10-CM | POA: Diagnosis present

## 2015-02-10 DIAGNOSIS — Z950 Presence of cardiac pacemaker: Secondary | ICD-10-CM | POA: Diagnosis present

## 2015-02-10 HISTORY — DX: Unspecified macular degeneration: H35.30

## 2015-02-10 HISTORY — DX: Rheumatoid arthritis, unspecified: M06.9

## 2015-02-10 HISTORY — DX: Hyperlipidemia, unspecified: E78.5

## 2015-02-10 HISTORY — DX: Malignant neoplasm of unspecified site of right female breast: C50.911

## 2015-02-10 HISTORY — DX: Unspecified urinary incontinence: R32

## 2015-02-10 HISTORY — DX: Presence of cardiac pacemaker: Z95.0

## 2015-02-10 HISTORY — DX: Bilateral primary osteoarthritis of knee: M17.0

## 2015-02-10 HISTORY — DX: Migraine, unspecified, not intractable, without status migrainosus: G43.909

## 2015-02-10 HISTORY — DX: Type 2 diabetes mellitus without complications: E11.9

## 2015-02-10 LAB — TROPONIN I: Troponin I: <0.03 ng/mL (ref <0.031)

## 2015-02-10 LAB — BASIC METABOLIC PANEL
Anion gap: 11 (ref 5–15)
BUN: 60 mg/dL — AB (ref 6–20)
CALCIUM: 8.7 mg/dL — AB (ref 8.9–10.3)
CO2: 21 mmol/L — ABNORMAL LOW (ref 22–32)
CREATININE: 2.26 mg/dL — AB (ref 0.44–1.00)
Chloride: 103 mmol/L (ref 101–111)
GFR calc Af Amer: 21 mL/min — ABNORMAL LOW (ref >60)
GFR, EST NON AFRICAN AMERICAN: 18 mL/min — AB (ref >60)
GLUCOSE: 127 mg/dL — AB (ref 65–99)
Potassium: 4.5 mmol/L (ref 3.5–5.1)
Sodium: 135 mmol/L (ref 135–145)

## 2015-02-10 LAB — CBC
HEMATOCRIT: 30.5 % — AB (ref 36.0–46.0)
Hemoglobin: 9.7 g/dL — ABNORMAL LOW (ref 12.0–15.0)
MCH: 27.6 pg (ref 26.0–34.0)
MCHC: 31.8 g/dL (ref 30.0–36.0)
MCV: 86.9 fL (ref 78.0–100.0)
PLATELETS: 184 10*3/uL (ref 150–400)
RBC: 3.51 MIL/uL — ABNORMAL LOW (ref 3.87–5.11)
RDW: 17.5 % — AB (ref 11.5–15.5)
WBC: 4.4 10*3/uL (ref 4.0–10.5)

## 2015-02-10 LAB — HEPATIC FUNCTION PANEL
ALT: 21 U/L (ref 14–54)
AST: 29 U/L (ref 15–41)
Albumin: 2.9 g/dL — ABNORMAL LOW (ref 3.5–5.0)
Alkaline Phosphatase: 65 U/L (ref 38–126)
BILIRUBIN DIRECT: 0.3 mg/dL (ref 0.1–0.5)
BILIRUBIN INDIRECT: 0.6 mg/dL (ref 0.3–0.9)
Total Bilirubin: 0.9 mg/dL (ref 0.3–1.2)
Total Protein: 6 g/dL — ABNORMAL LOW (ref 6.5–8.1)

## 2015-02-10 LAB — GLUCOSE, CAPILLARY: Glucose-Capillary: 82 mg/dL (ref 65–99)

## 2015-02-10 LAB — BRAIN NATRIURETIC PEPTIDE: B Natriuretic Peptide: 658.7 pg/mL — ABNORMAL HIGH (ref 0.0–100.0)

## 2015-02-10 LAB — I-STAT TROPONIN, ED: TROPONIN I, POC: 0.01 ng/mL (ref 0.00–0.08)

## 2015-02-10 MED ORDER — WARFARIN SODIUM 3 MG PO TABS: 3.0000 mg | ORAL_TABLET | Freq: Every day | ORAL | Status: DC

## 2015-02-10 MED ORDER — ENSURE ENLIVE PO LIQD
237.0000 mL | Freq: Two times a day (BID) | ORAL | Status: DC
  Administered 2015-02-11: 237 mL via ORAL

## 2015-02-10 MED ORDER — SODIUM CHLORIDE 0.9 % IV SOLN: 250.0000 mL | INTRAVENOUS | Status: DC | PRN

## 2015-02-10 MED ORDER — FUROSEMIDE 10 MG/ML IJ SOLN
20.0000 mg | Freq: Once | INTRAMUSCULAR | Status: AC
  Administered 2015-02-10: 20 mg via INTRAVENOUS
  Filled 2015-02-10: qty 2

## 2015-02-10 MED ORDER — FUROSEMIDE 10 MG/ML IJ SOLN: 40.0000 mg | Freq: Once | INTRAMUSCULAR | Status: DC

## 2015-02-10 MED ORDER — ROSUVASTATIN CALCIUM 5 MG PO TABS
5.0000 mg | ORAL_TABLET | Freq: Every day | ORAL | Status: DC
  Administered 2015-02-11 – 2015-02-14 (×4): 5 mg via ORAL
  Filled 2015-02-10 (×5): qty 1

## 2015-02-10 MED ORDER — ACETAMINOPHEN 325 MG PO TABS: 650.0000 mg | ORAL_TABLET | ORAL | Status: DC | PRN

## 2015-02-10 MED ORDER — AMIODARONE HCL 200 MG PO TABS
100.0000 mg | ORAL_TABLET | Freq: Every day | ORAL | Status: DC
  Administered 2015-02-11 – 2015-02-14 (×5): 100 mg via ORAL
  Filled 2015-02-10 (×4): qty 1

## 2015-02-10 MED ORDER — SODIUM CHLORIDE 0.9 % IJ SOLN: 3.0000 mL | INTRAMUSCULAR | Status: DC | PRN

## 2015-02-10 MED ORDER — SODIUM CHLORIDE 0.9 % IJ SOLN
3.0000 mL | Freq: Two times a day (BID) | INTRAMUSCULAR | Status: DC
  Administered 2015-02-10 – 2015-02-13 (×6): 3 mL via INTRAVENOUS

## 2015-02-10 MED ORDER — INSULIN ASPART 100 UNIT/ML ~~LOC~~ SOLN
0.0000 [IU] | Freq: Three times a day (TID) | SUBCUTANEOUS | Status: DC
  Administered 2015-02-11 – 2015-02-13 (×6): 2 [IU] via SUBCUTANEOUS
  Administered 2015-02-13: 1 [IU] via SUBCUTANEOUS
  Administered 2015-02-13 – 2015-02-14 (×2): 3 [IU] via SUBCUTANEOUS
  Administered 2015-02-14: 1 [IU] via SUBCUTANEOUS

## 2015-02-10 MED ORDER — OMEGA-3-ACID ETHYL ESTERS 1 G PO CAPS
2.0000 g | ORAL_CAPSULE | Freq: Every day | ORAL | Status: DC
  Administered 2015-02-11 – 2015-02-14 (×4): 2 g via ORAL
  Filled 2015-02-10 (×4): qty 2

## 2015-02-10 MED ORDER — ONDANSETRON HCL 4 MG/2ML IJ SOLN: 4.0000 mg | Freq: Four times a day (QID) | INTRAMUSCULAR | Status: DC | PRN

## 2015-02-10 MED ORDER — METOPROLOL SUCCINATE ER 50 MG PO TB24
50.0000 mg | ORAL_TABLET | Freq: Every day | ORAL | Status: DC
  Filled 2015-02-10: qty 1

## 2015-02-10 MED ORDER — FUROSEMIDE 10 MG/ML IJ SOLN
40.0000 mg | Freq: Every day | INTRAMUSCULAR | Status: DC
  Administered 2015-02-11 – 2015-02-13 (×4): 40 mg via INTRAVENOUS
  Filled 2015-02-10 (×4): qty 4

## 2015-02-10 MED ORDER — MECLIZINE HCL 25 MG PO TABS: 25.0000 mg | ORAL_TABLET | Freq: Two times a day (BID) | ORAL | Status: DC | PRN

## 2015-02-10 MED ORDER — PANTOPRAZOLE SODIUM 40 MG PO TBEC
40.0000 mg | DELAYED_RELEASE_TABLET | Freq: Every day | ORAL | Status: DC
Start: 2015-02-11 — End: 2015-02-14
  Administered 2015-02-11 – 2015-02-14 (×4): 40 mg via ORAL
  Filled 2015-02-10 (×3): qty 1

## 2015-02-10 NOTE — ED Provider Notes (Signed)
CSN: [id number]     Arrival date & time 02/10/15  1551 History   First MD Initiated Contact with Patient 02/10/15 1637     Chief Complaint  Patient presents with  . Shortness of Breath     (Consider location/radiation/quality/duration/timing/severity/associated sxs/prior Treatment) Patient is a 81 y.o. female presenting with shortness of breath. The history is provided by the patient and a relative. The history is limited by the condition of the patient.  Shortness of Breath Severity:  Moderate Onset quality:  Gradual Duration:  1 week Timing:  Constant Progression:  Worsening Chronicity:  New Relieved by:  None tried Worsened by:  Exertion Ineffective treatments:  Position changes, rest and sitting up Associated symptoms: no abdominal pain, no chest pain, no diaphoresis, no fever, no headaches and no rash   Risk factors: prolonged immobilization   Risk factors: no hx of PE/DVT     Past Medical History  Diagnosis Date  . Sick sinus syndrome (HCC)     Atrial fibrillation + bradycardia; St. Jude dual-chamber device in 2013; normal EF + mild to moderate LVH on echocardiogram  . Hypertension   . Pulmonary hypertension (HCC)     Estimated PA pressure of 50 mmHg  . Presence of permanent cardiac pacemaker   . Hyperlipidemia   . Type II diabetes mellitus ([place])   . Migraine     "none in years; had 3 spells w/bright, flickering lights in June 2016; no pain; they were just like I had migraines but without the awful pain" (02/10/2015)  . Osteoarthritis of both knees   . Rheumatoid arthritis involving both hands ([place])   . Cancer of right breast ([place])   . Urinary incontinence   . Macular degeneration of both eyes    Past Surgical History  Procedure Laterality Date  . Bowel resection      "I don't remember that but I have had 3 ORs in my abdomen" (02/11/2015)  . Ovarian cyst removal Left   . Tonsillectomy    . A-v cardiac pacemaker insertion  05/2011    St. Jude Accent DR  dual-chamber pacemaker  . Permanent pacemaker insertion N/A 05/17/2011    Procedure: PERMANENT PACEMAKER INSERTION;  Surgeon: Evans Lance, MD;  Location: [facility] CATH LAB;  Service: Cardiovascular;  Laterality: N/A;  . Abdominal hysterectomy    . Breast biopsy Right   . Mastectomy Right   . Eye surgery Bilateral     "Dr. Katy Fitch operated on them; I don't know what he did" (02/10/2015)  . Orif distal radius fracture Right 10/2003    Archie Endo 10/19/2012  . Wrist fracture surgery Left     "they had to put it together w/metal"  . Fracture surgery    . Thigh / knee soft tissue biopsy Right ~ 2014    "for knots in both thighs; mainly on the right; not cancer"   History reviewed. No pertinent family history. Social History  Substance Use Topics  . Smoking status: Never Smoker   . Smokeless tobacco: Never Used  . Alcohol Use: No   OB History    No data available     Review of Systems  Constitutional: Positive for fatigue and unexpected weight change. Negative for fever, chills and diaphoresis.  HENT: Negative for facial swelling.   Respiratory: Positive for shortness of breath.   Cardiovascular: Positive for leg swelling. Negative for chest pain.  Gastrointestinal: Negative for abdominal pain.  Genitourinary: Negative for dysuria.  Musculoskeletal: Negative for back pain.  Skin:  Negative for rash.  Neurological: Negative for headaches.  Psychiatric/Behavioral: Negative for confusion.      Allergies  Other  Home Medications   Prior to Admission medications   Medication Sig Start Date End Date Taking? Authorizing Provider  amiodarone (PACERONE) 100 MG tablet TAKE 1 TABLET (100 MG TOTAL) BY MOUTH DAILY. Patient taking differently: TAKE 1 TABLET (100 MG TOTAL) BY MOUTH DAILY AT BEDTIME 03/10/14  Yes Evans Lance, MD  fish oil-omega-3 fatty acids 1000 MG capsule Take 2 g by mouth daily.   Yes Historical Provider, MD  meclizine (ANTIVERT) 25 MG tablet Take 25 mg by mouth 2 (two) times  daily as needed for dizziness.    Yes Historical Provider, MD  metoprolol succinate (TOPROL-XL) 50 MG 24 hr tablet Take 1 tablet (50 mg total) by mouth daily. Take with or immediately following a meal. 02/05/12  Yes Yehuda Savannah, MD  Multiple Vitamins-Minerals (PRESERVISION AREDS 2 PO) Take 2 capsules by mouth daily.   Yes Historical Provider, MD  pantoprazole (PROTONIX) 40 MG tablet Take 40 mg by mouth daily. 01/12/15  Yes Historical Provider, MD  rosuvastatin (CRESTOR) 5 MG tablet Take 5 mg by mouth daily.   Yes Historical Provider, MD  torsemide (DEMADEX) 20 MG tablet Take 20-40 mg by mouth daily as needed (fluid).  02/09/15  Yes Historical Provider, MD  warfarin (COUMADIN) 3 MG tablet Take 3-4.5 mg by mouth daily at 6 PM. Takes 3 mg daily except 1.5 tablet ( 4.5 mg) on Thursdays and Sundays   Yes Historical Provider, MD  ciprofloxacin (CIPRO) 250 MG tablet Take 1 tablet (250 mg total) by mouth daily with breakfast. Patient not taking: Reported on 02/10/2015 01/19/14   Kathie Dike, MD  valACYclovir (VALTREX) 1000 MG tablet Take 1 tablet (1,000 mg total) by mouth 3 (three) times daily. Patient not taking: Reported on 02/10/2015 11/20/14   Billy Fischer, MD   BP 132/72 mmHg  Pulse 78  Temp(Src) 98.8 F (37.1 C) (Oral)  Resp 18  SpO2 98% Physical Exam  Constitutional: She is oriented to person, place, and time. She appears well-developed and well-nourished. No distress.  HENT:  Head: Normocephalic and atraumatic.  Right Ear: External ear normal.  Left Ear: External ear normal.  Nose: Nose normal.  Mouth/Throat: Oropharynx is clear and moist. No oropharyngeal exudate.  Eyes: Conjunctivae and EOM are normal. Pupils are equal, round, and reactive to light. Right eye exhibits no discharge. Left eye exhibits no discharge. No scleral icterus.  Neck: Normal range of motion. Neck supple. No JVD present. No tracheal deviation present. No thyromegaly present.  Cardiovascular: Normal rate,  regular rhythm and intact distal pulses.   Pulmonary/Chest: Effort normal and breath sounds normal. No stridor. No respiratory distress. She has no wheezes. She has no rales. She exhibits no tenderness.  Abdominal: Soft. She exhibits no distension.  Musculoskeletal: Normal range of motion. She exhibits edema. She exhibits no tenderness.  Lymphadenopathy:    She has no cervical adenopathy.  Neurological: She is alert and oriented to person, place, and time. She displays atrophy. She displays no tremor. She exhibits normal muscle tone. She displays no seizure activity. GCS eye subscore is 4. GCS verbal subscore is 5. GCS motor subscore is 6.  Skin: Skin is warm and dry. No rash noted. She is not diaphoretic. No erythema. No pallor.  Psychiatric: She has a normal mood and affect. Her behavior is normal. Judgment and thought content normal.  Nursing note and vitals reviewed.  ED Course  Procedures (including critical care time) Labs Review Labs Reviewed  BASIC METABOLIC PANEL - Abnormal; Notable for the following:    CO2 21 (*)    Glucose, Bld 127 (*)    BUN 60 (*)    Creatinine, Ser 2.26 (*)    Calcium 8.7 (*)    GFR calc non Af Amer 18 (*)    GFR calc Af Amer 21 (*)    All other components within normal limits  CBC - Abnormal; Notable for the following:    RBC 3.51 (*)    Hemoglobin 9.7 (*)    HCT 30.5 (*)    RDW 17.5 (*)    All other components within normal limits  HEPATIC FUNCTION PANEL - Abnormal; Notable for the following:    Total Protein 6.0 (*)    Albumin 2.9 (*)    All other components within normal limits  BRAIN NATRIURETIC PEPTIDE - Abnormal; Notable for the following:    B Natriuretic Peptide 658.7 (*)    All other components within normal limits  BASIC METABOLIC PANEL - Abnormal; Notable for the following:    Glucose, Bld 131 (*)    BUN 61 (*)    Creatinine, Ser 2.39 (*)    Calcium 8.4 (*)    GFR calc non Af Amer 17 (*)    GFR calc Af Amer 19 (*)    All other  components within normal limits  PROTIME-INR - Abnormal; Notable for the following:    Prothrombin Time 51.8 (*)    INR 6.05 (*)    All other components within normal limits  GLUCOSE, CAPILLARY - Abnormal; Notable for the following:    Glucose-Capillary 191 (*)    All other components within normal limits  GLUCOSE, CAPILLARY - Abnormal; Notable for the following:    Glucose-Capillary 199 (*)    All other components within normal limits  GLUCOSE, CAPILLARY - Abnormal; Notable for the following:    Glucose-Capillary 162 (*)    All other components within normal limits  TROPONIN I  TROPONIN I  TROPONIN I  GLUCOSE, CAPILLARY  GLUCOSE, CAPILLARY  BASIC METABOLIC PANEL  PROTIME-INR  I-STAT TROPOININ, ED    Imaging Review Dg Chest 2 View  02/10/2015  CLINICAL DATA:  81 year old with generalized weakness and shortness of breath which the patient temporally relates to her flu shot 3 weeks ago. EXAM: CHEST  2 VIEW COMPARISON:  10/02/2014 and earlier. FINDINGS: Cardiac silhouette moderately enlarged, unchanged. Left subclavian dual lead transvenous pacemaker unchanged and intact. Airspace consolidation in the right lower lobe and right middle lobe and a small to moderate sized right pleural effusion, new since the most recent prior examination. Left lung remains essentially clear. Pulmonary vascularity normal. Calcified tracheobronchial cartilages and bronchiectasis in the left lower lobe, unchanged. Degenerative changes involving the thoracic and upper lumbar spine. IMPRESSION: Right lower lobe and right middle lobe pneumonia with an associated small to moderate-sized right pleural effusion. Electronically Signed   By: Evangeline Dakin M.D.   On: 02/10/2015 17:59   I have personally reviewed and evaluated these images and lab results as part of my medical decision-making.   EKG Interpretation   Date/Time:  Wednesday February 10 2015 16:09:28 EDT Ventricular Rate:  84 PR Interval:    QRS  Duration: 164 QT Interval:  426 QTC Calculation: 503 R Axis:   -19 Text Interpretation:  Atrial fibrillation with occasional  ventricular-paced complexes Left bundle branch block Abnormal ECG  VENTRICULAR PACED RHYTHM Left  bundle branch block Artifact Abnormal ekg  Confirmed by Carmin Muskrat  MD [phone]) on 02/10/2015 4:48:18 PM      MDM   Final diagnoses:  Acute on chronic congestive heart failure, unspecified congestive heart failure type ([place])    Pt with no know hx of CHF per family, but AF on coumadin with limited mobility.  Sent from PCP for anasarca and pleural effusions.  Worsening orthopnea over 1 week.  Other ROS negative for signs of infectious process.  Likely CHF given age and Hx of AF.  Lab w/u obtained.  Pt given diuresis for CHF exacerbation.  To be admitted to medicine.  No other acute laboratory abnormalities which require further ED intervention.  No indication of acute infectious process on workup.  Pt otherwise HDS in NAD.  Patient care was discussed with my attending, Dr. Vanita Panda.     Hoyle Sauer, MD 02/12/15 [id number]  Carmin Muskrat, MD 02/15/15 1059

## 2015-02-10 NOTE — ED Notes (Signed)
Pt remains monitored by blood pressure, pulse ox, and 5 lead. pts family at bedside.

## 2015-02-10 NOTE — ED Notes (Signed)
Pt sent here from her doctor with bilateral pleural effusions and increased INR of 6.2. Pt has swelling in right hand and arm and SOB with exertion.

## 2015-02-10 NOTE — H&P (Signed)
PCP:   Dwan Bolt, MD   Chief Complaint:  Sob  HPI: 81 yo female h/o DM, pacemaker for sick sinus, htn comes in with several days of swelling and sob.  She went to see her pcp yesterday who started her on demadex, she has taken once and was suppose to follow up with PCP today but pt was too weak to get to that office visit.  Pt has no known h/o chf.  She denies chest pain.  She has diffuse anasarca, and more so in her right arm which an ultrasound was done several days ago to r/o dvt which was negative.  Her appetite has been declining per her son, she lives with her son who is present.  History obtained from pt and son.  Pt reports her sob is worse when she lies flat.  She feels weak.  No pain.  No fevers.  No cough.  Pt referred for admission for new chf.  Review of Systems:  Positive and negative as per HPI otherwise all other systems are negative  Past Medical History: Past Medical History  Diagnosis Date  . Diabetes mellitus, type II ([place])   . Macular degeneration syndrome   . Sick sinus syndrome (HCC)     Atrial fibrillation + bradycardia; St. Jude dual-chamber device in 2013; normal EF + mild to moderate LVH on echocardiogram  . Hypertension   . Pulmonary hypertension (HCC)     Estimated PA pressure of 50 mmHg  . Cancer [facility])    Past Surgical History  Procedure Laterality Date  . Mastectomy    . Bowel resection    . Abdominal hysterectomy    . Ovarian cyst removal    . Tonsillectomy    . A-v cardiac pacemaker insertion  05/2011    St. Jude Accent DR dual-chamber pacemaker  . Permanent pacemaker insertion N/A 05/17/2011    Procedure: PERMANENT PACEMAKER INSERTION;  Surgeon: Evans Lance, MD;  Location: [facility] CATH LAB;  Service: Cardiovascular;  Laterality: N/A;    Medications: Prior to Admission medications   Medication Sig Start Date End Date Taking? Authorizing Provider  amiodarone (PACERONE) 100 MG tablet TAKE 1 TABLET (100 MG TOTAL) BY MOUTH DAILY. Patient  taking differently: TAKE 1 TABLET (100 MG TOTAL) BY MOUTH DAILY AT BEDTIME 03/10/14  Yes Evans Lance, MD  fish oil-omega-3 fatty acids 1000 MG capsule Take 2 g by mouth daily.   Yes Historical Provider, MD  meclizine (ANTIVERT) 25 MG tablet Take 25 mg by mouth 2 (two) times daily as needed for dizziness.    Yes Historical Provider, MD  metoprolol succinate (TOPROL-XL) 50 MG 24 hr tablet Take 1 tablet (50 mg total) by mouth daily. Take with or immediately following a meal. 02/05/12  Yes Yehuda Savannah, MD  Multiple Vitamins-Minerals (PRESERVISION AREDS 2 PO) Take 2 capsules by mouth daily.   Yes Historical Provider, MD  pantoprazole (PROTONIX) 40 MG tablet Take 40 mg by mouth daily. 01/12/15  Yes Historical Provider, MD  rosuvastatin (CRESTOR) 5 MG tablet Take 5 mg by mouth daily.   Yes Historical Provider, MD  torsemide (DEMADEX) 20 MG tablet Take 20-40 mg by mouth daily as needed (fluid).  02/09/15  Yes Historical Provider, MD  warfarin (COUMADIN) 3 MG tablet Take 3-4.5 mg by mouth daily at 6 PM. Takes 3 mg daily except 1.5 tablet ( 4.5 mg) on Thursdays and Sundays   Yes Historical Provider, MD  ciprofloxacin (CIPRO) 250 MG tablet Take 1 tablet (250  mg total) by mouth daily with breakfast. Patient not taking: Reported on 02/10/2015 01/19/14   Kathie Dike, MD  valACYclovir (VALTREX) 1000 MG tablet Take 1 tablet (1,000 mg total) by mouth 3 (three) times daily. Patient not taking: Reported on 02/10/2015 11/20/14   Billy Fischer, MD    Allergies:   Allergies  Allergen Reactions  . Other     Muscle relaxer says it made her" fall in the floor" "took my legs away"    Social History:  reports that she has never smoked. She has never used smokeless tobacco. She reports that she does not drink alcohol or use illicit drugs.  Family History: No premature CAD  Physical Exam: Filed Vitals:   02/10/15 1610 02/10/15 1830 02/10/15 1844  BP: 132/72 100/56 100/56  Pulse: 78 80 80  Temp: 98.8 F  (37.1 C)    TempSrc: Oral    Resp: 18 17 14   SpO2: 98% 100% 100%   General appearance: alert, cooperative and no distress Head: Normocephalic, without obvious abnormality, atraumatic Eyes: negative Nose: Nares normal. Septum midline. Mucosa normal. No drainage or sinus tenderness. Neck: JVD - 3 cm above sternal notch and supple, symmetrical, trachea midline Lungs: clear to auscultation bilaterally Heart: regular rate and rhythm, S1, S2 normal, no murmur, click, rub or gallop Abdomen: soft, non-tender; bowel sounds normal; no masses,  no organomegaly Extremities: edema 2+ ble, diffuse anasarca mild Pulses: 2+ and symmetric Skin: Skin color, texture, turgor normal. No rashes or lesions Neurologic: Grossly normal    Labs on Admission:   Recent Labs  02/10/15 1626  NA 135  K 4.5  CL 103  CO2 21*  GLUCOSE 127*  BUN 60*  CREATININE 2.26*  CALCIUM 8.7*    Recent Labs  02/10/15 1805  AST 29  ALT 21  ALKPHOS 65  BILITOT 0.9  PROT 6.0*  ALBUMIN 2.9*     Recent Labs  02/10/15 1626  WBC 4.4  HGB 9.7*  HCT 30.5*  MCV 86.9  PLT 184       Radiological Exams on dmission: Dg Chest 2 View  02/10/2015  CLINICAL DATA:  81 year old with generalized weakness and shortness of breath which the patient temporally relates to her flu shot 3 weeks ago. EXAM: CHEST  2 VIEW COMPARISON:  10/02/2014 and earlier. FINDINGS: Cardiac silhouette moderately enlarged, unchanged. Left subclavian dual lead transvenous pacemaker unchanged and intact. Airspace consolidation in the right lower lobe and right middle lobe and a small to moderate sized right pleural effusion, new since the most recent prior examination. Left lung remains essentially clear. Pulmonary vascularity normal. Calcified tracheobronchial cartilages and bronchiectasis in the left lower lobe, unchanged. Degenerative changes involving the thoracic and upper lumbar spine. IMPRESSION: Right lower lobe and right middle lobe  pneumonia with an associated small to moderate-sized right pleural effusion. Electronically Signed   By: Evangeline Dakin M.D.   On: 02/10/2015 17:59   US Venous Img Upper Uni Right  02/02/2015  CLINICAL DATA:  Right arm swelling EXAM: Right UPPER EXTREMITY VENOUS DOPPLER ULTRASOUND TECHNIQUE: Gray-scale sonography with graded compression, as well as color Doppler and duplex ultrasound were performed to evaluate the upper extremity deep venous system from the level of the subclavian vein and including the jugular, axillary, basilic, radial, ulnar and upper cephalic vein. Spectral Doppler was utilized to evaluate flow at rest and with distal augmentation maneuvers. COMPARISON:  None. FINDINGS: Contralateral Subclavian Vein: Respiratory phasicity is normal and symmetric with the symptomatic side. No evidence  of thrombus. Normal compressibility. Internal Jugular Vein: No evidence of thrombus. Normal compressibility, respiratory phasicity and response to augmentation. Subclavian Vein: No evidence of thrombus. Normal compressibility, respiratory phasicity and response to augmentation. Axillary Vein: No evidence of thrombus. Normal compressibility, respiratory phasicity and response to augmentation. Cephalic Vein: No evidence of thrombus. Normal compressibility, respiratory phasicity and response to augmentation. Basilic Vein: No evidence of thrombus. Normal compressibility, respiratory phasicity and response to augmentation. Brachial Veins: No evidence of thrombus. Normal compressibility, respiratory phasicity and response to augmentation. Radial Veins: No evidence of thrombus. Normal compressibility, respiratory phasicity and response to augmentation. Ulnar Veins: No evidence of thrombus. Normal compressibility, respiratory phasicity and response to augmentation. Venous Reflux:  None visualized. Other Findings:  None visualized. IMPRESSION: No evidence of deep venous thrombosis right upper extremity. Electronically  Signed   By: Lahoma Crocker M.D.   On: 02/02/2015 14:58    Assessment/Plan  81 yo female with new onset chf and malnourishment  Principal Problem:   CHF exacerbation ([place])- i think her swelling is due to both malnourishment with low albumin levels and probably chf component.  Place on chf pathway, lasix 40mg  iv daily due to renal disease.  Echo in the am.  Serial trop.  Her oxygen sats are normal, vss.    Active Problems:   Hypertension- stable   Presence of permanent cardiac pacemaker- stable   Diabetes mellitus, type II ([place])- ssi   Chronic kidney disease, stage 4, severely decreased GFR (HCC)-  Monitor closely while increasing her diuresis   Atrial fibrillation ([place])- ekg nonischemic, rate paced.   Anasarca-  Albumin low,  Appetite poor.  Add ensure.  Consider dietician consult  obs on tele bed.  Addressed code status with pt and son.  She is considering DNR but wants to think about it.  Full code at this time.  Ebubechukwu Jedlicka A 02/10/2015, 8:04 PM

## 2015-02-11 ENCOUNTER — Observation Stay (HOSPITAL_COMMUNITY): Payer: Medicare Other

## 2015-02-11 DIAGNOSIS — E785 Hyperlipidemia, unspecified: Secondary | ICD-10-CM | POA: Diagnosis present

## 2015-02-11 DIAGNOSIS — I10 Essential (primary) hypertension: Secondary | ICD-10-CM | POA: Diagnosis not present

## 2015-02-11 DIAGNOSIS — N184 Chronic kidney disease, stage 4 (severe): Secondary | ICD-10-CM

## 2015-02-11 DIAGNOSIS — I509 Heart failure, unspecified: Secondary | ICD-10-CM | POA: Diagnosis not present

## 2015-02-11 DIAGNOSIS — N182 Chronic kidney disease, stage 2 (mild): Secondary | ICD-10-CM

## 2015-02-11 DIAGNOSIS — E1122 Type 2 diabetes mellitus with diabetic chronic kidney disease: Secondary | ICD-10-CM | POA: Diagnosis present

## 2015-02-11 DIAGNOSIS — R791 Abnormal coagulation profile: Secondary | ICD-10-CM | POA: Diagnosis present

## 2015-02-11 DIAGNOSIS — H3530 Unspecified macular degeneration: Secondary | ICD-10-CM | POA: Diagnosis present

## 2015-02-11 DIAGNOSIS — L8990 Pressure ulcer of unspecified site, unspecified stage: Secondary | ICD-10-CM | POA: Diagnosis present

## 2015-02-11 DIAGNOSIS — Z79899 Other long term (current) drug therapy: Secondary | ICD-10-CM | POA: Diagnosis not present

## 2015-02-11 DIAGNOSIS — Z6827 Body mass index (BMI) 27.0-27.9, adult: Secondary | ICD-10-CM | POA: Diagnosis not present

## 2015-02-11 DIAGNOSIS — I130 Hypertensive heart and chronic kidney disease with heart failure and stage 1 through stage 4 chronic kidney disease, or unspecified chronic kidney disease: Secondary | ICD-10-CM | POA: Diagnosis present

## 2015-02-11 DIAGNOSIS — R601 Generalized edema: Secondary | ICD-10-CM | POA: Diagnosis not present

## 2015-02-11 DIAGNOSIS — I5033 Acute on chronic diastolic (congestive) heart failure: Secondary | ICD-10-CM | POA: Diagnosis present

## 2015-02-11 DIAGNOSIS — Z7901 Long term (current) use of anticoagulants: Secondary | ICD-10-CM | POA: Diagnosis not present

## 2015-02-11 DIAGNOSIS — I482 Chronic atrial fibrillation: Secondary | ICD-10-CM

## 2015-02-11 DIAGNOSIS — Z950 Presence of cardiac pacemaker: Secondary | ICD-10-CM | POA: Diagnosis not present

## 2015-02-11 DIAGNOSIS — I272 Other secondary pulmonary hypertension: Secondary | ICD-10-CM | POA: Diagnosis present

## 2015-02-11 DIAGNOSIS — R0602 Shortness of breath: Secondary | ICD-10-CM | POA: Diagnosis present

## 2015-02-11 DIAGNOSIS — E46 Unspecified protein-calorie malnutrition: Secondary | ICD-10-CM | POA: Diagnosis present

## 2015-02-11 LAB — BASIC METABOLIC PANEL
ANION GAP: 7 (ref 5–15)
BUN: 61 mg/dL — AB (ref 6–20)
CHLORIDE: 106 mmol/L (ref 101–111)
CO2: 22 mmol/L (ref 22–32)
Calcium: 8.4 mg/dL — ABNORMAL LOW (ref 8.9–10.3)
Creatinine, Ser: 2.39 mg/dL — ABNORMAL HIGH (ref 0.44–1.00)
GFR, EST AFRICAN AMERICAN: 19 mL/min — AB (ref >60)
GFR, EST NON AFRICAN AMERICAN: 17 mL/min — AB (ref >60)
Glucose, Bld: 131 mg/dL — ABNORMAL HIGH (ref 65–99)
POTASSIUM: 3.9 mmol/L (ref 3.5–5.1)
SODIUM: 135 mmol/L (ref 135–145)

## 2015-02-11 LAB — GLUCOSE, CAPILLARY
GLUCOSE-CAPILLARY: 99 mg/dL (ref 65–99)
GLUCOSE-CAPILLARY: 191 mg/dL — AB (ref 65–99)
Glucose-Capillary: 199 mg/dL — ABNORMAL HIGH (ref 65–99)
Glucose-Capillary: 162 mg/dL — ABNORMAL HIGH (ref 65–99)

## 2015-02-11 LAB — TROPONIN I: Troponin I: <0.03 ng/mL (ref <0.031)

## 2015-02-11 MED ORDER — METOPROLOL SUCCINATE ER 25 MG PO TB24
25.0000 mg | ORAL_TABLET | Freq: Every day | ORAL | Status: DC
  Administered 2015-02-12 – 2015-02-14 (×3): 25 mg via ORAL
  Filled 2015-02-11 (×3): qty 1

## 2015-02-11 MED ORDER — WARFARIN - PHARMACIST DOSING INPATIENT: Freq: Every day | Status: DC

## 2015-02-11 MED ORDER — VITAMIN K1 10 MG/ML IJ SOLN
2.0000 mg | Freq: Once | INTRAVENOUS | Status: AC
  Administered 2015-02-11: 2 mg via INTRAVENOUS
  Filled 2015-02-11: qty 0.2

## 2015-02-11 MED ORDER — SODIUM CHLORIDE 0.9 % IV BOLUS (SEPSIS)
250.0000 mL | Freq: Once | INTRAVENOUS | Status: AC
  Administered 2015-02-11: 250 mL via INTRAVENOUS

## 2015-02-11 MED ORDER — ENSURE ENLIVE PO LIQD
237.0000 mL | Freq: Every day | ORAL | Status: DC
  Administered 2015-02-12 – 2015-02-13 (×2): 237 mL via ORAL

## 2015-02-11 NOTE — Care Management Obs Status (Signed)
[place] NOTIFICATION   Patient Details  Name: Isabella Chen MRN: [id number] Date of Birth: 09/04/1921   Medicare Observation Status Notification Given:  Yes    Maryclare Labrador, RN 02/11/2015, 12:23 PM

## 2015-02-11 NOTE — Progress Notes (Signed)
[facility] PROGRESS NOTE  Isabella Chen JME:[id number] DOB: 02/20/1922 DOA: 02/10/2015 PCP: Dwan Bolt, MD  Assessment/Plan: 1-acute on chronic HF: appears to be diastolic in nature -will continue IV diuresis -continue metoprolol at lower dose -will follow 2-[place] -Daily weights and strict intake and output -low sodium diet  2-HTN: soft but stable -will monitor while on diuretics now  3-chronic renal failure: stage 4 -close monitoring while on IV diuretics -Daily BMET  4-Type 2 diabetes: will continue SSI -continue holding oral hypoglycemic agents  5-low albumin/protein calorie malnutrition:  -will continue feeding supplements -nutritional service consulted  6-chronic atrial fibrillation: rate controlled and stable -will continue metoprolol and pacerone -CHADSVASC score 4 -on coumadin -pharmacy to dose  7-supratherapeutic INR: will use Vit K -follow INR -holding coumadin today   Code Status: Full Family Communication: no family at bedside  Disposition Plan: to be determine, remains in the hospital for now for further diuresis   Consultants:  None   Procedures:  2-D echo pending:  Antibiotics:  None   HPI/Subjective: Afebrile, no CP, no nausea, vomiting or abd pain. Reports that her breathing is better.  Objective: Filed Vitals:   02/11/15 1052  BP: 98/52  Pulse:   Temp:   Resp:     Intake/Output Summary (Last 24 hours) at 02/11/15 1108 Last data filed at 02/11/15 0529  Gross per 24 hour  Intake      0 ml  Output   1000 ml  Net  -1000 ml   Filed Weights   02/10/15 2053 02/11/15 0523  Weight: 65 kg (143 lb 4.8 oz) 65.2 kg (143 lb 11.8 oz)    Exam:   General:  Afebrile, no CP. Patient is hard of hearing. No acute distress; reporting that her breathing is better.  Cardiovascular: S1 and S2, no rubs or gallops, no JVD  Respiratory: bibasilar crackles (mild), no wheezing, good air movement  Abdomen: abd soft, NT, ND,  positive BS  Musculoskeletal:1+ edema LE and upper extremities, no cyanosis  Data Reviewed: Basic Metabolic Panel:  Recent Labs Lab 02/10/15 1626 02/11/15 0309  NA 135 135  K 4.5 3.9  CL 103 106  CO2 21* 22  GLUCOSE 127* 131*  BUN 60* 61*  CREATININE 2.26* 2.39*  CALCIUM 8.7* 8.4*   Liver Function Tests:  Recent Labs Lab 02/10/15 1805  AST 29  ALT 21  ALKPHOS 65  BILITOT 0.9  PROT 6.0*  ALBUMIN 2.9*   CBC:  Recent Labs Lab 02/10/15 1626  WBC 4.4  HGB 9.7*  HCT 30.5*  MCV 86.9  PLT 184   Cardiac Enzymes:  Recent Labs Lab 02/10/15 2156 02/11/15 0309 02/11/15 0850  TROPONINI <0.03 <0.03 <0.03   BNP (last 3 results)  Recent Labs  02/10/15 1820  BNP 658.7*   CBG:  Recent Labs Lab 02/10/15 2044 02/11/15 0617  GLUCAP 82 99    Studies: Dg Chest 2 View  02/10/2015  CLINICAL DATA:  81 year old with generalized weakness and shortness of breath which the patient temporally relates to her flu shot 3 weeks ago. EXAM: CHEST  2 VIEW COMPARISON:  10/02/2014 and earlier. FINDINGS: Cardiac silhouette moderately enlarged, unchanged. Left subclavian dual lead transvenous pacemaker unchanged and intact. Airspace consolidation in the right lower lobe and right middle lobe and a small to moderate sized right pleural effusion, new since the most recent prior examination. Left lung remains essentially clear. Pulmonary vascularity normal. Calcified tracheobronchial cartilages and bronchiectasis in the left lower lobe, unchanged. Degenerative changes  involving the thoracic and upper lumbar spine. IMPRESSION: Right lower lobe and right middle lobe pneumonia with an associated small to moderate-sized right pleural effusion. Electronically Signed   By: Evangeline Dakin M.D.   On: 02/10/2015 17:59    Scheduled Meds: . amiodarone  100 mg Oral Daily  . feeding supplement (ENSURE ENLIVE)  237 mL Oral BID BM  . furosemide  40 mg Intravenous Daily  . insulin aspart  0-9 Units  Subcutaneous TID WC  . [START ON 02/12/2015] metoprolol succinate  25 mg Oral Daily  . omega-3 acid ethyl esters  2 g Oral Daily  . pantoprazole  40 mg Oral Daily  . phytonadione (VITAMIN K) IV  2 mg Intravenous Once  . rosuvastatin  5 mg Oral Daily  . sodium chloride  3 mL Intravenous Q12H  . Warfarin - Pharmacist Dosing Inpatient   Does not apply q1800   Continuous Infusions:   Principal Problem:   CHF exacerbation ([place]) Active Problems:   Hypertension   Presence of permanent cardiac pacemaker   Diabetes mellitus, type II ([place])   Chronic kidney disease, stage 4, severely decreased GFR (HCC)   Atrial fibrillation ([place])   Anasarca   Pressure ulcer    Time spent: 35 minutes    Barton Dubois  [facility] Pager [phone]. If 7PM-7AM, please contact night-coverage at [email or web address], password [email or web address] 02/11/2015, 11:08 AM

## 2015-02-11 NOTE — Care Management Note (Signed)
Case Management Note  Patient Details  Name: Isabella Chen MRN: [id number] Date of Birth: 24-Oct-1921  Subjective/Objective:    Pt admitted with CHF excerbation                Action/Plan:  Pt is from home with son and daughter-in-law, dependent on walker.  CM performed HF screening, pt daughter in law at bedside.  Pt stated she did not weigh her self everyday prior to admit but agrees that she will do it from now on (daughter in law agrees as well).  Pt states she adheres to a no sodium diet.  Pt informed CM that she is "unsteady " at times.  CM ordered PT/OT evaluation per HF protocol.  CM will continue to monitor for disposition needs.   Expected Discharge Date:                  Expected Discharge Plan:  [place] (Pt is from home with son and daughter-in-law, dependent on walker)  In-House Referral:     Discharge planning Services  CM Consult  Post Acute Care Choice:    Choice offered to:     DME Arranged:    DME Agency:     HH Arranged:    HH Agency:     Status of Service:  In process, will continue to follow  Medicare Important Message Given:    Date Medicare IM Given:    Medicare IM give by:    Date Additional Medicare IM Given:    Additional Medicare Important Message give by:     If discussed at [place] of Stay Meetings, dates discussed:    Additional Comments:  Maryclare Labrador, RN 02/11/2015, 4:05 PM

## 2015-02-11 NOTE — Progress Notes (Signed)
ANTICOAGULATION CONSULT NOTE - Initial Consult  Pharmacy Consult for warfarin  Indication: atrial fibrillation  Allergies  Allergen Reactions  . Other     Muscle relaxer says it made her" fall in the floor" "took my legs away"    Patient Measurements: Height: 5\' 1"  (154.9 cm) Weight: 143 lb 11.8 oz (65.2 kg) IBW/kg (Calculated) : 47.8 Heparin Dosing Weight:   Vital Signs: Temp: 98.1 F (36.7 C) (11/03 0523) Temp Source: Oral (11/03 0523) BP: 82/44 mmHg (11/03 0533) Pulse Rate: 76 (11/03 0523)  Labs:  Recent Labs  02/10/15 1626 02/10/15 2156 02/11/15 0309  HGB 9.7*  --   --   HCT 30.5*  --   --   PLT 184  --   --   LABPROT  --   --  51.8*  INR  --   --  6.05*  CREATININE 2.26*  --  2.39*  TROPONINI  --  <0.03 <0.03    Estimated Creatinine Clearance: 13.3 mL/min (by C-G formula based on Cr of 2.39).   Medical History: Past Medical History  Diagnosis Date  . Sick sinus syndrome (HCC)     Atrial fibrillation + bradycardia; St. Jude dual-chamber device in 2013; normal EF + mild to moderate LVH on echocardiogram  . Hypertension   . Pulmonary hypertension (HCC)     Estimated PA pressure of 50 mmHg  . Presence of permanent cardiac pacemaker   . Hyperlipidemia   . Type II diabetes mellitus ([place])   . Migraine     "none in years; had 3 spells w/bright, flickering lights in June 2016; no pain; they were just like I had migraines but without the awful pain" (02/10/2015)  . Osteoarthritis of both knees   . Rheumatoid arthritis involving both hands ([place])   . Cancer of right breast ([place])   . Urinary incontinence   . Macular degeneration of both eyes     Medications:  Prescriptions prior to admission  Medication Sig Dispense Refill Last Dose  . amiodarone (PACERONE) 100 MG tablet TAKE 1 TABLET (100 MG TOTAL) BY MOUTH DAILY. (Patient taking differently: TAKE 1 TABLET (100 MG TOTAL) BY MOUTH DAILY AT BEDTIME) 90 tablet 3 02/09/2015 at Unknown time  . fish oil-omega-3  fatty acids 1000 MG capsule Take 2 g by mouth daily.   02/10/2015 at Unknown time  . meclizine (ANTIVERT) 25 MG tablet Take 25 mg by mouth 2 (two) times daily as needed for dizziness.    unknown  . metoprolol succinate (TOPROL-XL) 50 MG 24 hr tablet Take 1 tablet (50 mg total) by mouth daily. Take with or immediately following a meal. 30 tablet 6 02/10/2015 at 800  . Multiple Vitamins-Minerals (PRESERVISION AREDS 2 PO) Take 2 capsules by mouth daily.   02/10/2015 at Unknown time  . pantoprazole (PROTONIX) 40 MG tablet Take 40 mg by mouth daily.   02/10/2015 at Unknown time  . rosuvastatin (CRESTOR) 5 MG tablet Take 5 mg by mouth daily.   02/10/2015 at Unknown time  . torsemide (DEMADEX) 20 MG tablet Take 20-40 mg by mouth daily as needed (fluid).    02/09/2015 at Unknown time  . warfarin (COUMADIN) 3 MG tablet Take 3-4.5 mg by mouth daily at 6 PM. Takes 3 mg daily except 1.5 tablet ( 4.5 mg) on Thursdays and Sundays   02/07/2015  . ciprofloxacin (CIPRO) 250 MG tablet Take 1 tablet (250 mg total) by mouth daily with breakfast. (Patient not taking: Reported on 02/10/2015) 3 tablet 0 Completed  Course at Unknown time  . valACYclovir (VALTREX) 1000 MG tablet Take 1 tablet (1,000 mg total) by mouth 3 (three) times daily. (Patient not taking: Reported on 02/10/2015) 21 tablet 0 Completed Course at Unknown time    Assessment:  81 yo female on warfarin PTA for AFib. PTA dose: 4.5 mg qThurs/Sun, 3 mg all other days. INR 6.2.  Goal of Therapy:  INR 2-3 Monitor platelets by anticoagulation protocol: Yes     Plan:  -Hold warfarin  -Daily INR   Hughes Better, PharmD, BCPS Clinical Pharmacist 02/11/2015 7:41 AM

## 2015-02-11 NOTE — Care Management Obs Status (Signed)
[place] NOTIFICATION   Patient Details  Name: Isabella Chen MRN: [id number] Date of Birth: 31-Jan-1922   Medicare Observation Status Notification Given:       Maryclare Labrador, RN 02/11/2015, 12:23 PM

## 2015-02-11 NOTE — Progress Notes (Signed)
  Echocardiogram 2D Echocardiogram has been performed.  Donata Clay 02/11/2015, 12:03 PM

## 2015-02-11 NOTE — Progress Notes (Addendum)
Initial Nutrition Assessment  DOCUMENTATION CODES:   Not applicable  INTERVENTION:   Ensure Enlive po daily, each supplement provides 350 kcal and 20 grams of protein  NUTRITION DIAGNOSIS:   Increased nutrient needs related to acute illness as evidenced by estimated needs  GOAL:   Patient will meet greater than or equal to 90% of their needs  MONITOR:   PO intake, Supplement acceptance, Labs, Weight trends, I & O's  REASON FOR ASSESSMENT:   Consult Assessment of nutrition requirement/status  ASSESSMENT:   81 yo female h/o DM, pacemaker for sick sinus, HTN admitted with several days of swelling and SOB.  Patient a bit confused upon RD interview.  Reports she's been eating well, however, per H&P she's been experiencing a decline in appetite (pt lives with son).  Unable to report state whether she's lost weight.  Currently on Heart Healthy diet.  No % PO intake records available.  RD to add oral nutrition supplements.  Nutrition focused physical exam completed.  No muscle or subcutaneous fat depletion noticed.  + mild/moderate edema to upper and lower extremities.  Diet Order:  Diet Heart Room service appropriate?: Yes; Fluid consistency:: Thin  Skin:  Wound (see comment) (Stage I to buttock)  Last BM:  10/31  Height:   Ht Readings from Last 1 Encounters:  02/10/15 5\' 1"  (1.549 m)    Weight:   Wt Readings from Last 1 Encounters:  02/11/15 143 lb 11.8 oz (65.2 kg)    Ideal Body Weight:  47.7 kg  BMI:  Body mass index is 27.17 kg/(m^2).  Estimated Nutritional Needs:   Kcal:  1200-1400  Protein:  60-70 gm  Fluid:  >/= 1.5 L  EDUCATION NEEDS:   No education needs identified at this time  Arthur Holms, RD, LDN Pager #: [phone] After-Hours Pager #: [phone]

## 2015-02-12 DIAGNOSIS — R601 Generalized edema: Secondary | ICD-10-CM

## 2015-02-12 DIAGNOSIS — I5033 Acute on chronic diastolic (congestive) heart failure: Secondary | ICD-10-CM

## 2015-02-12 LAB — BASIC METABOLIC PANEL
Anion gap: 12 (ref 5–15)
BUN: 61 mg/dL — ABNORMAL HIGH (ref 6–20)
CALCIUM: 8.7 mg/dL — AB (ref 8.9–10.3)
CHLORIDE: 101 mmol/L (ref 101–111)
CO2: 22 mmol/L (ref 22–32)
CREATININE: 2.44 mg/dL — AB (ref 0.44–1.00)
GFR calc Af Amer: 19 mL/min — ABNORMAL LOW (ref >60)
GFR calc non Af Amer: 16 mL/min — ABNORMAL LOW (ref >60)
GLUCOSE: 142 mg/dL — AB (ref 65–99)
Potassium: 4 mmol/L (ref 3.5–5.1)
Sodium: 135 mmol/L (ref 135–145)

## 2015-02-12 LAB — PROTIME-INR
INR: 6.05 — AB (ref 0.00–1.49)
INR: 2.13 — AB (ref 0.00–1.49)
PROTHROMBIN TIME: 51.8 s — AB (ref 11.6–15.2)
Prothrombin Time: 23.7 seconds — ABNORMAL HIGH (ref 11.6–15.2)

## 2015-02-12 LAB — GLUCOSE, CAPILLARY
GLUCOSE-CAPILLARY: 187 mg/dL — AB (ref 65–99)
GLUCOSE-CAPILLARY: 158 mg/dL — AB (ref 65–99)
GLUCOSE-CAPILLARY: 162 mg/dL — AB (ref 65–99)
Glucose-Capillary: 160 mg/dL — ABNORMAL HIGH (ref 65–99)

## 2015-02-12 MED ORDER — WARFARIN SODIUM 5 MG PO TABS
5.0000 mg | ORAL_TABLET | Freq: Once | ORAL | Status: AC
  Administered 2015-02-12: 5 mg via ORAL
  Filled 2015-02-12: qty 1

## 2015-02-12 NOTE — Evaluation (Addendum)
Physical Therapy Evaluation Patient Details Name: Isabella Chen MRN: [id number] DOB: [birth year] Today's Date: 02/12/2015   History of Present Illness  81 yo female h/o DM, Chronic renal failure, pacemaker for sick sinus, htn comes in with several days of swelling and sob.  Work up and treatment for CHF.  Clinical Impression  Pt admitted with/for CHF.  Pt currently limited functionally due to the problems listed below.  (see problems list.)  Pt will benefit from PT to maximize function and safety to be able to get home safely with available assist of family.     Follow Up Recommendations Home health PT    Equipment Recommendations  None recommended by PT    Recommendations for Other Services       Precautions / Restrictions Precautions Precautions: Fall      Mobility  Bed Mobility Overal bed mobility: Needs Assistance Bed Mobility: Supine to Sit     Supine to sit: Min guard        Transfers Overall transfer level: Needs assistance Equipment used: Rolling walker (2 wheeled) Transfers: Sit to/from Stand Sit to Stand: Mod assist         General transfer comment: cues for hand placement and assist to both come forward and lift.  Ambulation/Gait Ambulation/Gait assistance: Min assist Ambulation Distance (Feet): 10 Feet Assistive device: Rolling walker (2 wheeled) Gait Pattern/deviations: Step-through pattern;Decreased step length - right;Decreased step length - left;Decreased stride length     General Gait Details: short unsteady, tentative steps with flexed posture.  Stairs            Wheelchair Mobility    Modified Rankin (Stroke Patients Only)       Balance Overall balance assessment: Needs assistance Sitting-balance support: No upper extremity supported Sitting balance-Leahy Scale: Fair     Standing balance support: Bilateral upper extremity supported;Single extremity supported Standing balance-Leahy Scale: Poor                                Pertinent Vitals/Pain Pain Assessment: Faces Faces Pain Scale: Hurts even more Pain Location: R knee Pain Descriptors / Indicators: Grimacing;Discomfort Pain Intervention(s): Limited activity within patient's tolerance;Repositioned    Home Living Family/patient expects to be discharged to:: Private residence Living Arrangements: Children;Other relatives Available Help at Discharge: Family;Available 24 hours/day;Available PRN/intermittently Type of Home: House Home Access: Stairs to enter Entrance Stairs-Rails: Right;Left Entrance Stairs-Number of Steps: 4 Home Layout: Two level;Able to live on main level with bedroom/bathroom Home Equipment: Bedside commode;Walker - 2 wheels;Walker - 4 wheels      Prior Function Level of Independence: Needs assistance   Gait / Transfers Assistance Needed: supervision to min assist with RW  ADL's / Homemaking Assistance Needed: set up for bath and dressing        Hand Dominance        Extremity/Trunk Assessment   Upper Extremity Assessment: Defer to OT evaluation           Lower Extremity Assessment: RLE deficits/detail;LLE deficits/detail RLE Deficits / Details: proximal weakness 3/5, quads >=3/5, hams 3+5 LLE Deficits / Details: grossly 4/5 except hip flexors 3+5  Cervical / Trunk Assessment:  (truncal weakness)  Communication   Communication: HOH  Cognition Arousal/Alertness: Awake/alert Behavior During Therapy: WFL for tasks assessed/performed Overall Cognitive Status: Within Functional Limits for tasks assessed  General Comments  Pt's EHR labile into the 130's and 140's    Exercises        Assessment/Plan    PT Assessment Patient needs continued PT services  PT Diagnosis Difficulty walking;Acute pain;Generalized weakness   PT Problem List Decreased strength;Decreased activity tolerance;Decreased balance;Decreased mobility;Decreased knowledge of use of DME;Pain  PT  Treatment Interventions Gait training;DME instruction;Functional mobility training;Therapeutic activities;Patient/family education   PT Goals (Current goals can be found in the Care Plan section) Acute Rehab PT Goals Patient Stated Goal: Back home   PT Goal Formulation: With patient Time For Goal Achievement: 02/26/15 Potential to Achieve Goals: Fair    Frequency Min 3X/week   Barriers to discharge        Co-evaluation               End of Session   Activity Tolerance: Patient tolerated treatment well;Patient limited by pain Patient left: in chair;with call bell/phone within reach;with family/visitor present Nurse Communication: Mobility status         Time: 1131-1201 PT Time Calculation (min) (ACUTE ONLY): 30 min   Charges:   PT Evaluation $Initial PT Evaluation Tier I: 1 Procedure PT Treatments $Therapeutic Activity: 8-22 mins   PT G Codes:        Annalese Stiner, Tessie Fass 02/12/2015, 12:24 PM 02/12/2015  Donnella Sham, PT [phone]  (pager)

## 2015-02-12 NOTE — Evaluation (Signed)
Occupational Therapy Evaluation Patient Details Name: Isabella Chen MRN: [id number] DOB: 11-26-1921 Today's Date: 02/12/2015    History of Present Illness 81 yo female h/o DM, Chronic renal failure, pacemaker for sick sinus, htn comes in with several days of swelling and sob.  Work up and treatment for CHF.   Clinical Impression   Pt admitted with above. She demonstrates the below listed deficits and will benefit from continued OT to maximize safety and independence with BADLs.  Pt presents to OT with generalized weakness and impaired balance.  She currently requires min - mod A for ADLs.  Recommend HHOT.  Will follow.      Follow Up Recommendations  Home health OT;Supervision/Assistance - 24 hour    Equipment Recommendations  None recommended by OT    Recommendations for Other Services       Precautions / Restrictions Precautions Precautions: Fall      Mobility Bed Mobility Overal bed mobility: Needs Assistance Bed Mobility: Supine to Sit;Sit to Supine     Supine to sit: Min guard Sit to supine: Mod assist   General bed mobility comments: increased time.  Requires assist to lift LEs into bed   Transfers Overall transfer level: Needs assistance Equipment used: Rolling walker (2 wheeled) Transfers: Sit to/from Stand Sit to Stand: Min assist         General transfer comment: Pt moves slowly.  Min A to move into standing     Balance Overall balance assessment: Needs assistance Sitting-balance support: Feet supported Sitting balance-Leahy Scale: Good     Standing balance support: Bilateral upper extremity supported Standing balance-Leahy Scale: Poor                              ADL Overall ADL's : Needs assistance/impaired Eating/Feeding: Independent   Grooming: Wash/dry hands;Wash/dry face;Oral care;Brushing hair;Set up;Sitting   Upper Body Bathing: Set up;Sitting   Lower Body Bathing: Moderate assistance;Sit to/from stand   Upper Body  Dressing : Set up;Sitting   Lower Body Dressing: Moderate assistance;Sit to/from stand   Toilet Transfer: Minimal assistance;Ambulation;Comfort height toilet;Grab bars;RW   Toileting- Clothing Manipulation and Hygiene: Minimal assistance;Sit to/from stand       Functional mobility during ADLs: Minimal assistance;Min guard;Rolling walker General ADL Comments: Pt requires min a for sit to stand and fatigues quickly      Vision     Perception     Praxis      Pertinent Vitals/Pain Pain Assessment: Faces Faces Pain Scale: Hurts little more Pain Location: Rt knee Pain Descriptors / Indicators: Grimacing Pain Intervention(s): Monitored during session     Hand Dominance Right   Extremity/Trunk Assessment Upper Extremity Assessment Upper Extremity Assessment: Generalized weakness   Lower Extremity Assessment Lower Extremity Assessment: Defer to PT evaluation RLE Deficits / Details: proximal weakness 3/5, quads >=3/5, hams 3+5 RLE: Unable to fully assess due to pain LLE Deficits / Details: grossly 4/5 except hip flexors 3+5   Cervical / Trunk Assessment Cervical / Trunk Assessment: Kyphotic   Communication Communication Communication: HOH   Cognition Arousal/Alertness: Awake/alert Behavior During Therapy: WFL for tasks assessed/performed Overall Cognitive Status: Within Functional Limits for tasks assessed                     General Comments       Exercises       Shoulder Instructions      Home Living Family/patient expects to be discharged  to:: Private residence Living Arrangements: Children;Other relatives Available Help at Discharge: Family;Available 24 hours/day;Available PRN/intermittently Type of Home: House Home Access: Stairs to enter [employer] of Steps: 4 Entrance Stairs-Rails: Right;Left Home Layout: Two level;Able to live on main level with bedroom/bathroom Alternate Level Stairs-Number of Steps: flight Alternate Level  Stairs-Rails: Right;Left     Bathroom Toilet: Standard     Home Equipment: Bedside commode;Walker - 2 wheels;Walker - 4 wheels   Additional Comments: Pt report she sponge bathes      Prior Functioning/Environment Level of Independence: Needs assistance  Gait / Transfers Assistance Needed: supervision to min assist with RW ADL's / Homemaking Assistance Needed: set up for bath and dressing        OT Diagnosis: Generalized weakness;Acute pain   OT Problem List: Decreased strength;Decreased activity tolerance;Impaired balance (sitting and/or standing);Decreased knowledge of use of DME or AE;Cardiopulmonary status limiting activity;Pain   OT Treatment/Interventions: Self-care/ADL training;Therapeutic exercise;Energy conservation;DME and/or AE instruction;Therapeutic activities;Patient/family education;Balance training    OT Goals(Current goals can be found in the care plan section) Acute Rehab OT Goals Patient Stated Goal: Back home   OT Goal Formulation: With patient Time For Goal Achievement: 02/19/15 Potential to Achieve Goals: Good ADL Goals Pt Will Perform Grooming: with supervision;standing Pt Will Perform Lower Body Bathing: with supervision;sit to/from stand Pt Will Perform Lower Body Dressing: with supervision;sit to/from stand Pt Will Transfer to Toilet: with supervision;ambulating;regular height toilet;bedside commode;grab bars Pt Will Perform Toileting - Clothing Manipulation and hygiene: with supervision;sit to/from stand  OT Frequency: Min 2X/week   Barriers to D/C:            Co-evaluation              End of Session Equipment Utilized During Treatment: Surveyor, mining Communication: Mobility status  Activity Tolerance: Patient limited by fatigue Patient left: in bed;with call bell/phone within reach;with bed alarm set;with family/visitor present   Time: 7782-4235 OT Time Calculation (min): 26 min Charges:  OT General Charges $OT Visit: 1  Procedure OT Evaluation $Initial OT Evaluation Tier I: 1 Procedure OT Treatments $Self Care/Home Management : 8-22 mins G-Codes:    Chaela Branscum M 02-26-15, 2:43 PM

## 2015-02-12 NOTE — Care Management Note (Addendum)
Case Management Note  Patient Details  Name: SKYLIE HIOTT MRN: [id number] Date of Birth: Dec 20, 1921  Subjective/Objective:    Pt admitted with CHF excerbation                Action/Plan:  Pt is from home with son and daughter-in-law, dependent on walker.  Pt will have 24 hour supervision provided by daughter-in-law.  CM performed HF screening, pt daughter in law at bedside.  Pt stated she did not weigh her self everyday prior to admit but agrees that she will do it from now on (daughter in law agrees as well).  Pt states she adheres to a no sodium diet.  Pt informed CM that she is "unsteady " at times.  CM ordered PT/OT evaluation per HF protocol.  CM will continue to monitor for disposition needs.   Expected Discharge Date:                  Expected Discharge Plan:  [place] (Pt is from home with son and daughter-in-law, dependent on walker)  In-House Referral:     Discharge planning Services  CM Consult  Post Acute Care Choice:    Choice offered to:     DME Arranged:    DME Agency:     HH Arranged:   PT,OT [place] Agency:   [facility]  Status of Service:  In process, will continue to follow  Medicare Important Message Given:    Date Medicare IM Given:    Medicare IM give by:    Date Additional Medicare IM Given:    Additional Medicare Important Message give by:     If discussed at [place] of Stay Meetings, dates discussed:    Additional Comments: CM offered choice, pt and son at bedside chose [facility], agency contacted and accepted referral.  CM verified pts address and phone numbers. Maryclare Labrador, RN 02/12/2015, 2:48 PM

## 2015-02-12 NOTE — Progress Notes (Signed)
[facility] PROGRESS NOTE  Isabella Chen WEX:[id number] DOB: December 24, 1921 DOA: 02/10/2015 PCP: Dwan Bolt, MD  Assessment/Plan: 1-acute on chronic diastolic HF: preserved EF -will continue IV diuresis -continue metoprolol at low dose -normal troponin and preserved EF/no wall motion abnormalities on Echo -Daily weights and strict intake and output -low sodium diet -at discharge will use demadex daily -will need another 48 hours most likely -CXR demonstrating scarring and vascular congestion; RLL ?? PNA, but patient clinically w/o any signs of infection. Will hold antibiotics and continue treatment for CHF exacerbation.  2-HTN: soft but stable -will monitor while on diuretics now -no orthostatic changes and with good mentation   3-chronic renal failure: stage 4 -close monitoring while on IV diuretics -Daily BMET -has remained overall stable  4-Type 2 diabetes: will continue SSI -continue holding oral hypoglycemic agents  5-low albumin/protein calorie malnutrition:  -will continue feeding supplements -nutritional service consulted  6-chronic atrial fibrillation:  -will continue metoprolol and pacerone for rate control -CHADSVASC score 4 -on coumadin -pharmacy to dose  7-supratherapeutic INR:  -after vit K given, INR is in therapeutic range -pharmacy to help with dose -follow INR  Code Status: Full Family Communication: no family at bedside  Disposition Plan: to be determine, remains in the hospital for now for further diuresis   Consultants:  None   Procedures:  2-D echo pending: - Left ventricle: The cavity size was normal. There was moderate concentric hypertrophy. Systolic function was normal. The estimated ejection fraction was in the range of 55% to 60%. - Aortic valve: Cusp separation was mildly reduced. Transvalvular velocity was increased. There was mild to moderate stenosis. There was trivial regurgitation. Valve area (VTI): 1.02  cm^2. Valve area (Vmax): 0.98 cm^2. Valve area (Vmean): 0.97 cm^2. - Mitral valve: There was severe regurgitation. - Left atrium: The atrium was severely dilated. - Right ventricle: The cavity size was mildly dilated. Wall thickness was normal. - Right atrium: The atrium was moderately to severely dilated. - Tricuspid valve: There was moderate-severe regurgitation. - Pulmonary arteries: Systolic pressure was moderately to severely increased. PA peak pressure: 70 mm Hg (S).  Antibiotics:  None   HPI/Subjective: Afebrile, no CP, no nausea, vomiting or abd pain. Breathing and swelling continue improving   Objective: Filed Vitals:   02/12/15 0511  BP: 92/48  Pulse:   Temp:   Resp:     Intake/Output Summary (Last 24 hours) at 02/12/15 0856 Last data filed at 02/12/15 0808  Gross per 24 hour  Intake    240 ml  Output   1450 ml  Net  -1210 ml   Filed Weights   02/10/15 2053 02/11/15 0523 02/12/15 0456  Weight: 65 kg (143 lb 4.8 oz) 65.2 kg (143 lb 11.8 oz) 62.6 kg (138 lb 0.1 oz)    Exam:   General:  Afebrile, no CP. Patient is breathing better and less swollen. No acute distress; no cough.  Cardiovascular: S1 and S2, no rubs or gallops, no JVD; positive SEM  Respiratory: improve air movement, fine bibasilar crackles, no wheezing  Abdomen: abd soft, NT, ND, positive BS  Musculoskeletal: 1+ edema LE and upper extremities (mainly), no cyanosis  Data Reviewed: Basic Metabolic Panel:  Recent Labs Lab 02/10/15 1626 02/11/15 0309 02/12/15 0319  NA 135 135 135  K 4.5 3.9 4.0  CL 103 106 101  CO2 21* 22 22  GLUCOSE 127* 131* 142*  BUN 60* 61* 61*  CREATININE 2.26* 2.39* 2.44*  CALCIUM 8.7* 8.4* 8.7*  Liver Function Tests:  Recent Labs Lab 02/10/15 1805  AST 29  ALT 21  ALKPHOS 65  BILITOT 0.9  PROT 6.0*  ALBUMIN 2.9*   CBC:  Recent Labs Lab 02/10/15 1626  WBC 4.4  HGB 9.7*  HCT 30.5*  MCV 86.9  PLT 184   Cardiac Enzymes:  Recent  Labs Lab 02/10/15 2156 02/11/15 0309 02/11/15 0850  TROPONINI <0.03 <0.03 <0.03   BNP (last 3 results)  Recent Labs  02/10/15 1820  BNP 658.7*   CBG:  Recent Labs Lab 02/11/15 0617 02/11/15 1118 02/11/15 1617 02/11/15 2210 02/12/15 0628  GLUCAP 99 191* 199* 162* 187*    Studies: Dg Chest 2 View  02/10/2015  CLINICAL DATA:  81 year old with generalized weakness and shortness of breath which the patient temporally relates to her flu shot 3 weeks ago. EXAM: CHEST  2 VIEW COMPARISON:  10/02/2014 and earlier. FINDINGS: Cardiac silhouette moderately enlarged, unchanged. Left subclavian dual lead transvenous pacemaker unchanged and intact. Airspace consolidation in the right lower lobe and right middle lobe and a small to moderate sized right pleural effusion, new since the most recent prior examination. Left lung remains essentially clear. Pulmonary vascularity normal. Calcified tracheobronchial cartilages and bronchiectasis in the left lower lobe, unchanged. Degenerative changes involving the thoracic and upper lumbar spine. IMPRESSION: Right lower lobe and right middle lobe pneumonia with an associated small to moderate-sized right pleural effusion. Electronically Signed   By: Evangeline Dakin M.D.   On: 02/10/2015 17:59    Scheduled Meds: . amiodarone  100 mg Oral Daily  . feeding supplement (ENSURE ENLIVE)  237 mL Oral Q1400  . furosemide  40 mg Intravenous Daily  . insulin aspart  0-9 Units Subcutaneous TID WC  . metoprolol succinate  25 mg Oral Daily  . omega-3 acid ethyl esters  2 g Oral Daily  . pantoprazole  40 mg Oral Daily  . rosuvastatin  5 mg Oral Daily  . sodium chloride  3 mL Intravenous Q12H  . Warfarin - Pharmacist Dosing Inpatient   Does not apply q1800   Continuous Infusions:   Principal Problem:   CHF exacerbation ([place]) Active Problems:   Hypertension   Presence of permanent cardiac pacemaker   Diabetes mellitus, type II ([place])   Chronic kidney disease,  stage 4, severely decreased GFR (HCC)   Atrial fibrillation ([place])   Anasarca   Pressure ulcer    Time spent: 35 minutes    Barton Dubois  [facility] Pager [phone]. If 7PM-7AM, please contact night-coverage at [email or web address], password [facility] 02/12/2015, 8:56 AM  LOS: 1 day

## 2015-02-12 NOTE — Progress Notes (Signed)
ANTICOAGULATION CONSULT NOTE - Initial Consult  Pharmacy Consult for warfarin  Indication: atrial fibrillation  Allergies  Allergen Reactions  . Other     Muscle relaxer says it made her" fall in the floor" "took my legs away"    Patient Measurements: Height: 5\' 1"  (154.9 cm) Weight: 138 lb 0.1 oz (62.6 kg) IBW/kg (Calculated) : 47.8  Vital Signs: Temp: 97.7 F (36.5 C) (11/04 0456) Temp Source: Oral (11/04 0456) BP: 92/48 mmHg (11/04 0511) Pulse Rate: 103 (11/04 0456)  Labs:  Recent Labs  02/10/15 1626 02/10/15 2156 02/11/15 0309 02/11/15 0850 02/12/15 0319  HGB 9.7*  --   --   --   --   HCT 30.5*  --   --   --   --   PLT 184  --   --   --   --   LABPROT  --   --  51.8*  --  23.7*  INR  --   --  6.05*  --  2.13*  CREATININE 2.26*  --  2.39*  --  2.44*  TROPONINI  --  <0.03 <0.03 <0.03  --     Estimated Creatinine Clearance: 12.7 mL/min (by C-G formula based on Cr of 2.44).   Medical History: Past Medical History  Diagnosis Date  . Sick sinus syndrome (HCC)     Atrial fibrillation + bradycardia; St. Jude dual-chamber device in 2013; normal EF + mild to moderate LVH on echocardiogram  . Hypertension   . Pulmonary hypertension (HCC)     Estimated PA pressure of 50 mmHg  . Presence of permanent cardiac pacemaker   . Hyperlipidemia   . Type II diabetes mellitus ([place])   . Migraine     "none in years; had 3 spells w/bright, flickering lights in June 2016; no pain; they were just like I had migraines but without the awful pain" (02/10/2015)  . Osteoarthritis of both knees   . Rheumatoid arthritis involving both hands ([place])   . Cancer of right breast ([place])   . Urinary incontinence   . Macular degeneration of both eyes     Medications:  Prescriptions prior to admission  Medication Sig Dispense Refill Last Dose  . amiodarone (PACERONE) 100 MG tablet TAKE 1 TABLET (100 MG TOTAL) BY MOUTH DAILY. (Patient taking differently: TAKE 1 TABLET (100 MG TOTAL) BY MOUTH  DAILY AT BEDTIME) 90 tablet 3 02/09/2015 at Unknown time  . fish oil-omega-3 fatty acids 1000 MG capsule Take 2 g by mouth daily.   02/10/2015 at Unknown time  . meclizine (ANTIVERT) 25 MG tablet Take 25 mg by mouth 2 (two) times daily as needed for dizziness.    unknown  . metoprolol succinate (TOPROL-XL) 50 MG 24 hr tablet Take 1 tablet (50 mg total) by mouth daily. Take with or immediately following a meal. 30 tablet 6 02/10/2015 at 800  . Multiple Vitamins-Minerals (PRESERVISION AREDS 2 PO) Take 2 capsules by mouth daily.   02/10/2015 at Unknown time  . pantoprazole (PROTONIX) 40 MG tablet Take 40 mg by mouth daily.   02/10/2015 at Unknown time  . rosuvastatin (CRESTOR) 5 MG tablet Take 5 mg by mouth daily.   02/10/2015 at Unknown time  . torsemide (DEMADEX) 20 MG tablet Take 20-40 mg by mouth daily as needed (fluid).    02/09/2015 at Unknown time  . warfarin (COUMADIN) 3 MG tablet Take 3-4.5 mg by mouth daily at 6 PM. Takes 3 mg daily except 1.5 tablet ( 4.5 mg) on  Thursdays and Sundays   02/07/2015  . ciprofloxacin (CIPRO) 250 MG tablet Take 1 tablet (250 mg total) by mouth daily with breakfast. (Patient not taking: Reported on 02/10/2015) 3 tablet 0 Completed Course at Unknown time  . valACYclovir (VALTREX) 1000 MG tablet Take 1 tablet (1,000 mg total) by mouth 3 (three) times daily. (Patient not taking: Reported on 02/10/2015) 21 tablet 0 Completed Course at Unknown time    Assessment: 81 yo female on Coumadin PTA 3mg  daily exc 4.5mg  on Thurs/Sat for Afib. Has had a recent decrease in PO intake per son. INR on admit was elevated at 6.05, Vit K given. Now INR therapeutic at 2.13. Will most likely continue to trend down tomorrow. Admit note says [place] several days ago neg for DVT. Hgb 9.7, plts 184, no s/s of bleed.  Goal of Therapy:  INR 2-3 Monitor platelets by anticoagulation protocol: Yes  Plan:  Restart coumadin 5mg  PO x 1 tonight Monitor daily INR, CBC, s/s of bleed  Elenor Quinones,  PharmD Clinical Pharmacist Pager [phone] 02/12/2015 10:13 AM

## 2015-02-13 LAB — GLUCOSE, CAPILLARY
GLUCOSE-CAPILLARY: 166 mg/dL — AB (ref 65–99)
Glucose-Capillary: 139 mg/dL — ABNORMAL HIGH (ref 65–99)
Glucose-Capillary: 174 mg/dL — ABNORMAL HIGH (ref 65–99)
Glucose-Capillary: 202 mg/dL — ABNORMAL HIGH (ref 65–99)

## 2015-02-13 LAB — BASIC METABOLIC PANEL
Anion gap: 12 (ref 5–15)
BUN: 61 mg/dL — AB (ref 6–20)
CALCIUM: 8.5 mg/dL — AB (ref 8.9–10.3)
CO2: 23 mmol/L (ref 22–32)
CREATININE: 2.38 mg/dL — AB (ref 0.44–1.00)
Chloride: 100 mmol/L — ABNORMAL LOW (ref 101–111)
GFR calc Af Amer: 19 mL/min — ABNORMAL LOW (ref >60)
GFR, EST NON AFRICAN AMERICAN: 17 mL/min — AB (ref >60)
Glucose, Bld: 180 mg/dL — ABNORMAL HIGH (ref 65–99)
Potassium: 5.2 mmol/L — ABNORMAL HIGH (ref 3.5–5.1)
SODIUM: 135 mmol/L (ref 135–145)

## 2015-02-13 LAB — PROTIME-INR
INR: 1.63 — ABNORMAL HIGH (ref 0.00–1.49)
PROTHROMBIN TIME: 19.3 s — AB (ref 11.6–15.2)

## 2015-02-13 MED ORDER — TORSEMIDE 20 MG PO TABS
30.0000 mg | ORAL_TABLET | Freq: Every day | ORAL | Status: DC
  Administered 2015-02-13 – 2015-02-14 (×2): 30 mg via ORAL
  Filled 2015-02-13 (×2): qty 2

## 2015-02-13 MED ORDER — POLYVINYL ALCOHOL 1.4 % OP SOLN
1.0000 [drp] | OPHTHALMIC | Status: DC | PRN
  Filled 2015-02-13: qty 15

## 2015-02-13 MED ORDER — SALINE SPRAY 0.65 % NA SOLN
1.0000 | NASAL | Status: DC | PRN
  Administered 2015-02-13 (×2): 1 via NASAL
  Filled 2015-02-13 (×2): qty 44

## 2015-02-13 MED ORDER — WARFARIN SODIUM 3 MG PO TABS
3.0000 mg | ORAL_TABLET | Freq: Once | ORAL | Status: AC
  Administered 2015-02-13: 3 mg via ORAL
  Filled 2015-02-13: qty 1

## 2015-02-13 NOTE — Progress Notes (Signed)
[facility] PROGRESS NOTE  Isabella SHINGLEDECKER FBP:[id number] DOB: 1921-08-19 DOA: 02/10/2015 PCP: Dwan Bolt, MD  Assessment/Plan: 1-acute on chronic diastolic HF: preserved EF -will switch to PO demadex -continue metoprolol at low dose -normal troponin and preserved EF/no wall motion abnormalities on Echo -Daily weights and strict intake and output -low sodium diet -will follow volume status and if stable potentially can go home tomorrow -CXR demonstrating scarring and vascular congestion; RLL ?? PNA, but patient clinically w/o any signs of infection. Will hold antibiotics and continue treatment for CHF exacerbation.  2-HTN: soft but stable -will monitor while on diuretics now -no orthostatic changes and with good mentation overall  3-chronic renal failure: stage 4 -close monitoring while on diuretics -Daily BMET -has remained overall stable  4-Type 2 diabetes: will continue SSI -continue holding oral hypoglycemic agents  5-low albumin/protein calorie malnutrition:  -will continue feeding supplements -nutritional service consulted  6-chronic atrial fibrillation:  -will continue metoprolol and pacerone for rate control -CHADSVASC score 4 -on coumadin -pharmacy to dose  7-supratherapeutic INR:  -after vit K given, INR is in therapeutic range -pharmacy helping with dose -follow INR  Code Status: Full Family Communication: no family at bedside  Disposition Plan: to be determine, remains in the hospital for now for further diuresis; most likely    Consultants:  None   Procedures:  2-D echo pending: - Left ventricle: The cavity size was normal. There was moderate concentric hypertrophy. Systolic function was normal. The estimated ejection fraction was in the range of 55% to 60%. - Aortic valve: Cusp separation was mildly reduced. Transvalvular velocity was increased. There was mild to moderate stenosis. There was trivial regurgitation. Valve area  (VTI): 1.02 cm^2. Valve area (Vmax): 0.98 cm^2. Valve area (Vmean): 0.97 cm^2. - Mitral valve: There was severe regurgitation. - Left atrium: The atrium was severely dilated. - Right ventricle: The cavity size was mildly dilated. Wall thickness was normal. - Right atrium: The atrium was moderately to severely dilated. - Tricuspid valve: There was moderate-severe regurgitation. - Pulmonary arteries: Systolic pressure was moderately to severely increased. PA peak pressure: 70 mm Hg (S).  Antibiotics:  None   HPI/Subjective: Afebrile, no CP, no nausea, vomiting or abd pain. Breathing and swelling continue improving   Objective: Filed Vitals:   02/13/15 1100  BP: 95/53  Pulse: 105  Temp:   Resp:     Intake/Output Summary (Last 24 hours) at 02/13/15 1201 Last data filed at 02/13/15 1148  Gross per 24 hour  Intake    480 ml  Output   2651 ml  Net  -2171 ml   Filed Weights   02/11/15 0523 02/12/15 0456 02/13/15 0543  Weight: 65.2 kg (143 lb 11.8 oz) 62.6 kg (138 lb 0.1 oz) 65.1 kg (143 lb 8.3 oz)    Exam:   General:  Afebrile, no CP. Patient is breathing a lot better and reports significant improvement in her swelling. No acute distress  Cardiovascular: S1 and S2, no rubs or gallops, no JVD; positive SEM  Respiratory: improved air movement, no frank crackles, no wheezing  Abdomen: abd soft, NT, ND, positive BS  Musculoskeletal: trace lower extremity edema and 1+ upper extremities, no cyanosis  Data Reviewed: Basic Metabolic Panel:  Recent Labs Lab 02/10/15 1626 02/11/15 0309 02/12/15 0319 02/13/15 0322  NA 135 135 135 135  K 4.5 3.9 4.0 5.2*  CL 103 106 101 100*  CO2 21* 22 22 23   GLUCOSE 127* 131* 142* 180*  BUN 60* 61*  61* 61*  CREATININE 2.26* 2.39* 2.44* 2.38*  CALCIUM 8.7* 8.4* 8.7* 8.5*   Liver Function Tests:  Recent Labs Lab 02/10/15 1805  AST 29  ALT 21  ALKPHOS 65  BILITOT 0.9  PROT 6.0*  ALBUMIN 2.9*   CBC:  Recent  Labs Lab 02/10/15 1626  WBC 4.4  HGB 9.7*  HCT 30.5*  MCV 86.9  PLT 184   Cardiac Enzymes:  Recent Labs Lab 02/10/15 2156 02/11/15 0309 02/11/15 0850  TROPONINI <0.03 <0.03 <0.03   BNP (last 3 results)  Recent Labs  02/10/15 1820  BNP 658.7*   CBG:  Recent Labs Lab 02/12/15 1127 02/12/15 1637 02/12/15 2115 02/13/15 0613 02/13/15 1130  GLUCAP 158* 160* 162* 139* 174*    Studies: No results found.  Scheduled Meds: . amiodarone  100 mg Oral Daily  . feeding supplement (ENSURE ENLIVE)  237 mL Oral Q1400  . insulin aspart  0-9 Units Subcutaneous TID WC  . metoprolol succinate  25 mg Oral Daily  . omega-3 acid ethyl esters  2 g Oral Daily  . pantoprazole  40 mg Oral Daily  . rosuvastatin  5 mg Oral Daily  . sodium chloride  3 mL Intravenous Q12H  . torsemide  30 mg Oral Daily  . warfarin  3 mg Oral ONCE-1800  . Warfarin - Pharmacist Dosing Inpatient   Does not apply q1800   Continuous Infusions:   Principal Problem:   CHF exacerbation ([place]) Active Problems:   Hypertension   Presence of permanent cardiac pacemaker   Diabetes mellitus, type II ([place])   Chronic kidney disease, stage 4, severely decreased GFR (HCC)   Atrial fibrillation ([place])   Anasarca   Pressure ulcer    Time spent: 35 minutes    Barton Dubois  [facility] Pager [phone]. If 7PM-7AM, please contact night-coverage at [email or web address], password [facility] 02/13/2015, 12:01 PM  LOS: 2 days

## 2015-02-13 NOTE — Progress Notes (Signed)
ANTICOAGULATION CONSULT NOTE - Initial Consult  Pharmacy Consult for warfarin  Indication: atrial fibrillation  Allergies  Allergen Reactions  . Other     Muscle relaxer says it made her" fall in the floor" "took my legs away"    Patient Measurements: Height: 5\' 1"  (154.9 cm) Weight: 143 lb 8.3 oz (65.1 kg) IBW/kg (Calculated) : 47.8  Vital Signs: Temp: 97.9 F (36.6 C) (11/05 0543) Temp Source: Oral (11/05 0543) BP: 98/55 mmHg (11/05 0543) Pulse Rate: 100 (11/05 0543)  Labs:  Recent Labs  02/10/15 1626 02/10/15 2156 02/11/15 0309 02/11/15 0850 02/12/15 0319 02/13/15 0322  HGB 9.7*  --   --   --   --   --   HCT 30.5*  --   --   --   --   --   PLT 184  --   --   --   --   --   LABPROT  --   --  51.8*  --  23.7* 19.3*  INR  --   --  6.05*  --  2.13* 1.63*  CREATININE 2.26*  --  2.39*  --  2.44* 2.38*  TROPONINI  --  <0.03 <0.03 <0.03  --   --     Estimated Creatinine Clearance: 13.3 mL/min (by C-G formula based on Cr of 2.38).   Medical History: Past Medical History  Diagnosis Date  . Sick sinus syndrome (HCC)     Atrial fibrillation + bradycardia; St. Jude dual-chamber device in 2013; normal EF + mild to moderate LVH on echocardiogram  . Hypertension   . Pulmonary hypertension (HCC)     Estimated PA pressure of 50 mmHg  . Presence of permanent cardiac pacemaker   . Hyperlipidemia   . Type II diabetes mellitus ([place])   . Migraine     "none in years; had 3 spells w/bright, flickering lights in June 2016; no pain; they were just like I had migraines but without the awful pain" (02/10/2015)  . Osteoarthritis of both knees   . Rheumatoid arthritis involving both hands ([place])   . Cancer of right breast ([place])   . Urinary incontinence   . Macular degeneration of both eyes     Medications:  Prescriptions prior to admission  Medication Sig Dispense Refill Last Dose  . amiodarone (PACERONE) 100 MG tablet TAKE 1 TABLET (100 MG TOTAL) BY MOUTH DAILY. (Patient taking  differently: TAKE 1 TABLET (100 MG TOTAL) BY MOUTH DAILY AT BEDTIME) 90 tablet 3 02/09/2015 at Unknown time  . fish oil-omega-3 fatty acids 1000 MG capsule Take 2 g by mouth daily.   02/10/2015 at Unknown time  . meclizine (ANTIVERT) 25 MG tablet Take 25 mg by mouth 2 (two) times daily as needed for dizziness.    unknown  . metoprolol succinate (TOPROL-XL) 50 MG 24 hr tablet Take 1 tablet (50 mg total) by mouth daily. Take with or immediately following a meal. 30 tablet 6 02/10/2015 at 800  . Multiple Vitamins-Minerals (PRESERVISION AREDS 2 PO) Take 2 capsules by mouth daily.   02/10/2015 at Unknown time  . pantoprazole (PROTONIX) 40 MG tablet Take 40 mg by mouth daily.   02/10/2015 at Unknown time  . rosuvastatin (CRESTOR) 5 MG tablet Take 5 mg by mouth daily.   02/10/2015 at Unknown time  . torsemide (DEMADEX) 20 MG tablet Take 20-40 mg by mouth daily as needed (fluid).    02/09/2015 at Unknown time  . warfarin (COUMADIN) 3 MG tablet Take 3-4.5 mg  by mouth daily at 6 PM. Takes 3 mg daily except 1.5 tablet ( 4.5 mg) on Thursdays and Sundays   02/07/2015  . ciprofloxacin (CIPRO) 250 MG tablet Take 1 tablet (250 mg total) by mouth daily with breakfast. (Patient not taking: Reported on 02/10/2015) 3 tablet 0 Completed Course at Unknown time  . valACYclovir (VALTREX) 1000 MG tablet Take 1 tablet (1,000 mg total) by mouth 3 (three) times daily. (Patient not taking: Reported on 02/10/2015) 21 tablet 0 Completed Course at Unknown time    Assessment: 81 yo female on Coumadin 3mg  daily exc 4.5mg  on Thurs/Sat for Afib. Has had a recent decrease in PO intake per son. INR on admit was elevated at 6.05, Vit K given. INR came down to 2.13 so coumadin restarted. INR today is subtherapeutic at 1.63. Admit note says [place] several days ago neg for DVT.  Goal of Therapy:  INR 2-3 Monitor platelets by anticoagulation protocol: Yes  Plan:  Give coumadin 3mg  PO x 1 tonight Monitor daily INR, CBC, s/s of bleed  Elenor Quinones, PharmD Clinical Pharmacist Pager [phone] 02/13/2015 10:28 AM

## 2015-02-14 DIAGNOSIS — I5033 Acute on chronic diastolic (congestive) heart failure: Secondary | ICD-10-CM | POA: Insufficient documentation

## 2015-02-14 DIAGNOSIS — Z794 Long term (current) use of insulin: Secondary | ICD-10-CM

## 2015-02-14 DIAGNOSIS — Z950 Presence of cardiac pacemaker: Secondary | ICD-10-CM

## 2015-02-14 LAB — GLUCOSE, CAPILLARY
Glucose-Capillary: 133 mg/dL — ABNORMAL HIGH (ref 65–99)
Glucose-Capillary: 235 mg/dL — ABNORMAL HIGH (ref 65–99)

## 2015-02-14 LAB — BASIC METABOLIC PANEL
Anion gap: 9 (ref 5–15)
BUN: 62 mg/dL — AB (ref 6–20)
CO2: 29 mmol/L (ref 22–32)
CREATININE: 2.41 mg/dL — AB (ref 0.44–1.00)
Calcium: 8.5 mg/dL — ABNORMAL LOW (ref 8.9–10.3)
Chloride: 97 mmol/L — ABNORMAL LOW (ref 101–111)
GFR calc Af Amer: 19 mL/min — ABNORMAL LOW (ref >60)
GFR, EST NON AFRICAN AMERICAN: 16 mL/min — AB (ref >60)
GLUCOSE: 184 mg/dL — AB (ref 65–99)
POTASSIUM: 3.5 mmol/L (ref 3.5–5.1)
Sodium: 135 mmol/L (ref 135–145)

## 2015-02-14 LAB — PROTIME-INR
INR: 1.92 — AB (ref 0.00–1.49)
PROTHROMBIN TIME: 21.9 s — AB (ref 11.6–15.2)

## 2015-02-14 MED ORDER — INSULIN DETEMIR 100 UNIT/ML ~~LOC~~ SOLN: 10.0000 [IU] | Freq: Every day | SUBCUTANEOUS | Status: AC

## 2015-02-14 MED ORDER — WARFARIN SODIUM 3 MG PO TABS: 3.0000 mg | ORAL_TABLET | Freq: Once | ORAL | Status: DC

## 2015-02-14 MED ORDER — TORSEMIDE 20 MG PO TABS: 30.0000 mg | ORAL_TABLET | Freq: Every day | ORAL | Status: DC

## 2015-02-14 MED ORDER — ENSURE ENLIVE PO LIQD: 237.0000 mL | Freq: Every day | ORAL | Status: AC

## 2015-02-14 NOTE — Progress Notes (Signed)
ANTICOAGULATION CONSULT NOTE - Initial Consult  Pharmacy Consult for warfarin  Indication: atrial fibrillation  Allergies  Allergen Reactions  . Other     Muscle relaxer says it made her" fall in the floor" "took my legs away"    Patient Measurements: Height: 5\' 1"  (154.9 cm) Weight: 136 lb 4.8 oz (61.825 kg) IBW/kg (Calculated) : 47.8  Vital Signs: Temp: 97.8 F (36.6 C) (11/06 0355) Temp Source: Oral (11/06 0355) BP: 99/72 mmHg (11/06 0355) Pulse Rate: 66 (11/06 0355)  Labs:  Recent Labs  02/12/15 0319 02/13/15 0322 02/14/15 0109  LABPROT 23.7* 19.3* 21.9*  INR 2.13* 1.63* 1.92*  CREATININE 2.44* 2.38* 2.41*    Estimated Creatinine Clearance: 12.8 mL/min (by C-G formula based on Cr of 2.41).   Medical History: Past Medical History  Diagnosis Date  . Sick sinus syndrome (HCC)     Atrial fibrillation + bradycardia; St. Jude dual-chamber device in 2013; normal EF + mild to moderate LVH on echocardiogram  . Hypertension   . Pulmonary hypertension (HCC)     Estimated PA pressure of 50 mmHg  . Presence of permanent cardiac pacemaker   . Hyperlipidemia   . Type II diabetes mellitus ([place])   . Migraine     "none in years; had 3 spells w/bright, flickering lights in June 2016; no pain; they were just like I had migraines but without the awful pain" (02/10/2015)  . Osteoarthritis of both knees   . Rheumatoid arthritis involving both hands ([place])   . Cancer of right breast ([place])   . Urinary incontinence   . Macular degeneration of both eyes     Medications:  Prescriptions prior to admission  Medication Sig Dispense Refill Last Dose  . amiodarone (PACERONE) 100 MG tablet TAKE 1 TABLET (100 MG TOTAL) BY MOUTH DAILY. (Patient taking differently: TAKE 1 TABLET (100 MG TOTAL) BY MOUTH DAILY AT BEDTIME) 90 tablet 3 02/09/2015 at Unknown time  . fish oil-omega-3 fatty acids 1000 MG capsule Take 2 g by mouth daily.   02/10/2015 at Unknown time  . meclizine (ANTIVERT) 25 MG  tablet Take 25 mg by mouth 2 (two) times daily as needed for dizziness.    unknown  . metoprolol succinate (TOPROL-XL) 50 MG 24 hr tablet Take 1 tablet (50 mg total) by mouth daily. Take with or immediately following a meal. 30 tablet 6 02/10/2015 at 800  . Multiple Vitamins-Minerals (PRESERVISION AREDS 2 PO) Take 2 capsules by mouth daily.   02/10/2015 at Unknown time  . pantoprazole (PROTONIX) 40 MG tablet Take 40 mg by mouth daily.   02/10/2015 at Unknown time  . rosuvastatin (CRESTOR) 5 MG tablet Take 5 mg by mouth daily.   02/10/2015 at Unknown time  . torsemide (DEMADEX) 20 MG tablet Take 20-40 mg by mouth daily as needed (fluid).    02/09/2015 at Unknown time  . warfarin (COUMADIN) 3 MG tablet Take 3-4.5 mg by mouth daily at 6 PM. Takes 3 mg daily except 1.5 tablet ( 4.5 mg) on Thursdays and Sundays   02/07/2015  . ciprofloxacin (CIPRO) 250 MG tablet Take 1 tablet (250 mg total) by mouth daily with breakfast. (Patient not taking: Reported on 02/10/2015) 3 tablet 0 Completed Course at Unknown time  . valACYclovir (VALTREX) 1000 MG tablet Take 1 tablet (1,000 mg total) by mouth 3 (three) times daily. (Patient not taking: Reported on 02/10/2015) 21 tablet 0 Completed Course at Unknown time    Assessment: 81 yo female on Coumadin 3mg  daily  exc 4.5mg  on Thurs/Sat for Afib. Has had a recent decrease in PO intake per son. INR on admit was elevated at 6.05, Vit K given. INR went all the way down to 1.63. INR today after 2 doses is trending back up but slightly subtherapeutic at 1.92. Admit note says [place] several days ago neg for DVT.  Goal of Therapy:  INR 2-3 Monitor platelets by anticoagulation protocol: Yes  Plan:  Give coumadin 3mg  PO x 1 tonight Monitor daily INR, CBC, s/s of bleed  When discharged, will need reduced dose such as 3mg  daily since admitted with supratherapeutic INR  Elenor Quinones, PharmD Clinical Pharmacist Pager [phone] 02/14/2015 10:38 AM

## 2015-02-14 NOTE — Progress Notes (Signed)
Discussed with the patient and her son. All questioned fully answered. Discharge instructions included medications, daily weights, diet, mobility, and follow up appointments. Torsemide prescription called to [facility] in [place]. They will call me if any problems arise.  Fritz Pickerel, RN

## 2015-02-14 NOTE — Care Management Important Message (Signed)
Important Message  Patient Details  Name: Isabella Chen MRN: [id number] Date of Birth: 1921/11/29   Medicare Important Message Given:  Yes-second notification given    Nathen May 02/14/2015, 10:07 AM

## 2015-02-14 NOTE — Discharge Summary (Signed)
Physician Discharge Summary  Isabella Chen ZSM:[id number] DOB: [birth year] DOA: 02/10/2015  PCP: Isabella Bolt, MD  Admit date: 02/10/2015 Discharge date: 02/14/2015  Time spent: 40 minutes  Recommendations for Outpatient Follow-up:  Repeat BMET to follow electrolytes and renal function  Please reassess BP/volume status and adjust medication as needed Repeat CXR to follow resolution of vascular congestion and infiltrates/scarring behavior  Follow INR and adjust coumadin dose as needed (INR was elevated on admission)  Discharge Diagnoses:    Acute on chronic diastolic CHF exacerbation (Isabella Chen)   Hypertension   Presence of permanent cardiac pacemaker   Diabetes mellitus, type II (Isabella Chen) on insulin    Chronic kidney disease, stage 4, severely decreased GFR (Isabella Chen)   Chronic Atrial fibrillation (Isabella Chen)   Anasarca   HLD  Discharge Condition: stable and improved. Discharge home with home health services; Patient will follow up with PCP in 1 week  Diet recommendation: heart healthy diet/low sodium   Filed Weights   02/12/15 0456 02/13/15 0543 02/14/15 0355  Weight: 62.6 kg (138 lb 0.1 oz) 65.1 kg (143 lb 8.3 oz) 61.825 kg (136 lb 4.8 oz)    History of present illness:  81 yo female h/o DM, pacemaker for sick sinus, htn comes in with several days of swelling and sob. She went to see her pcp yesterday who started her on demadex, she has taken once and was suppose to follow up with PCP today but pt was too weak to get to that office visit. Pt has no known h/o chf. She denies chest pain. She has diffuse anasarca, and more so in her right arm which an ultrasound was done several days ago to r/o dvt which was negative. Her appetite has been declining per her son, she lives with her son who is present. History obtained from pt and son. Pt reports her sob is worse when she lies flat. She feels weak. No pain. No fevers. No cough. Pt referred for admission for further evaluation and treatment  of CHF.  Hospital Course:  1-acute on chronic diastolic HF: preserved EF -will discharge on PO demadex; patient will follow with PCP in 1 week -continue metoprolol at low dose -normal troponin and preserved EF; no wall motion abnormalities on Echo -Daily weights and continue low sodium diet -close follow up of volume status -CXR demonstrating scarring and vascular congestion; RLL ?? PNA, but patient clinically w/o any signs of infection. No antibiotics given and symptoms resolved with treatment for CHF exacerbation. Will recommend repeating CXR during follow up visit to assess vascular congestion resolution and observe scarring/infiltrate behavior   2-HTN: soft but stable -no orthostatic changes and with good mentation overall -will need follow up in 1 week with PCP for further adjustments in her antihypertensive regimen  3-chronic renal failure: stage 4 -has remained overall stable -close follow up in outpatient setting as patient is on demadex now  4-Type 2 diabetes:  -continue home hypoglycemic regimen   5-low albumin/protein calorie malnutrition:  -will continue feeding supplements (ensure) -appreciate nutritional service rec's  6-chronic atrial fibrillation:  -will continue metoprolol and pacerone for rate control -CHADSVASC score 4 -on coumadin for anticoagulation  7-supratherapeutic INR:  -after vit K given, INR is in therapeutic range -pharmacy helped with dose -will resumed coumadin home dose  -follow INR with PCP and adjust further as needed   Procedures:  2-D echo pending: - Left ventricle: The cavity size was normal. There was moderate concentric hypertrophy. Systolic function was normal. The estimated ejection fraction  was in the range of 55% to 60%. - Aortic valve: Cusp separation was mildly reduced. Transvalvular velocity was increased. There was mild to moderate stenosis. There was trivial regurgitation. Valve area (VTI): 1.02 cm^2. Valve  area (Vmax): 0.98 cm^2. Valve area (Vmean): 0.97 cm^2. - Mitral valve: There was severe regurgitation. - Left atrium: The atrium was severely dilated. - Right ventricle: The cavity size was mildly dilated. Wall thickness was normal. - Right atrium: The atrium was moderately to severely dilated. - Tricuspid valve: There was moderate-severe regurgitation. - Pulmonary arteries: Systolic pressure was moderately to severely increased. PA peak pressure: 70 mm Hg (S).  Consultations:  None   Discharge Exam: Filed Vitals:   02/14/15 1149  BP: 105/52  Pulse: 105  Temp:   Resp:     General: Afebrile, no CP. Patient is breathing a lot better and reports significant improvement in her swelling overall. No acute distress and no orthopnea  Cardiovascular: S1 and S2, no rubs or gallops, no JVD; positive SEM  Respiratory: improved air movement, no crackles, no wheezing; able to laying down flat and no use of accessory muscles  Abdomen: abd soft, NT, ND, positive BS  Musculoskeletal: trace lower extremity edema and trace to 1+ upper extremities, no cyanosis   Discharge Instructions   Discharge Instructions    Diet - low sodium heart healthy    Complete by:  As directed      Discharge instructions    Complete by:  As directed   Take medications as prescribed Follow low sodium diet (less than 2.5 gram daily) Please check weight on daily basis (contact PCP with increase weight > 3 pounds overnight and/o > 5 pounds in a week) Maintain adequate hydration          Current Discharge Medication List    START taking these medications   Details  feeding supplement, ENSURE ENLIVE, (ENSURE ENLIVE) LIQD Take 237 mLs by mouth daily at 2 PM daily at 2 PM.    insulin detemir (LEVEMIR) 100 UNIT/ML injection Inject 0.1 mLs (10 Units total) into the skin daily.      CONTINUE these medications which have CHANGED   Details  torsemide (DEMADEX) 20 MG tablet Take 1.5 tablets (30 mg total) by  mouth daily. Qty: 45 tablet, Refills: 1      CONTINUE these medications which have NOT CHANGED   Details  amiodarone (PACERONE) 100 MG tablet TAKE 1 TABLET (100 MG TOTAL) BY MOUTH DAILY. Qty: 90 tablet, Refills: 3    fish oil-omega-3 fatty acids 1000 MG capsule Take 2 g by mouth daily.    meclizine (ANTIVERT) 25 MG tablet Take 25 mg by mouth 2 (two) times daily as needed for dizziness.     metoprolol succinate (TOPROL-XL) 50 MG 24 hr tablet Take 1 tablet (50 mg total) by mouth daily. Take with or immediately following a meal. Qty: 30 tablet, Refills: 6    Multiple Vitamins-Minerals (PRESERVISION AREDS 2 PO) Take 2 capsules by mouth daily.    pantoprazole (PROTONIX) 40 MG tablet Take 40 mg by mouth daily.    rosuvastatin (CRESTOR) 5 MG tablet Take 5 mg by mouth daily.    warfarin (COUMADIN) 3 MG tablet Take 3-4.5 mg by mouth daily at 6 PM. Takes 3 mg daily except 1.5 tablet ( 4.5 mg) on Thursdays and Sundays      STOP taking these medications     ciprofloxacin (CIPRO) 250 MG tablet      valACYclovir (VALTREX) 1000 MG  tablet        Allergies  Allergen Reactions  . Other     Muscle relaxer says it made her" fall in the floor" "took my legs away"   Follow-up Information    Follow up with [facility].   Why:  physical therapy, occupational therapy   Contact information:   [address] [place] [address] [phone]       Follow up with Isabella Bolt, MD. Schedule an appointment as soon as possible for a visit in 1 week.   Specialty:  Endocrinology   Contact information:   [address] [place] [address] [phone]        The results of significant diagnostics from this hospitalization (including imaging, microbiology, ancillary and laboratory) are listed below for reference.    Significant Diagnostic Studies: Dg Chest 2 View  02/10/2015  CLINICAL DATA:  81 year old with generalized weakness and shortness  of breath which the patient temporally relates to her flu shot 3 weeks ago. EXAM: CHEST  2 VIEW COMPARISON:  10/02/2014 and earlier. FINDINGS: Cardiac silhouette moderately enlarged, unchanged. Left subclavian dual lead transvenous pacemaker unchanged and intact. Airspace consolidation in the right lower lobe and right middle lobe and a small to moderate sized right pleural effusion, new since the most recent prior examination. Left lung remains essentially clear. Pulmonary vascularity normal. Calcified tracheobronchial cartilages and bronchiectasis in the left lower lobe, unchanged. Degenerative changes involving the thoracic and upper lumbar spine. IMPRESSION: Right lower lobe and right middle lobe pneumonia with an associated small to moderate-sized right pleural effusion. Electronically Signed   By: Evangeline Dakin M.D.   On: 02/10/2015 17:59   US Venous Img Upper Uni Right  02/02/2015  CLINICAL DATA:  Right arm swelling EXAM: Right UPPER EXTREMITY VENOUS DOPPLER ULTRASOUND TECHNIQUE: Gray-scale sonography with graded compression, as well as color Doppler and duplex ultrasound were performed to evaluate the upper extremity deep venous system from the level of the subclavian vein and including the jugular, axillary, basilic, radial, ulnar and upper cephalic vein. Spectral Doppler was utilized to evaluate flow at rest and with distal augmentation maneuvers. COMPARISON:  None. FINDINGS: Contralateral Subclavian Vein: Respiratory phasicity is normal and symmetric with the symptomatic side. No evidence of thrombus. Normal compressibility. Internal Jugular Vein: No evidence of thrombus. Normal compressibility, respiratory phasicity and response to augmentation. Subclavian Vein: No evidence of thrombus. Normal compressibility, respiratory phasicity and response to augmentation. Axillary Vein: No evidence of thrombus. Normal compressibility, respiratory phasicity and response to augmentation. Cephalic Vein: No  evidence of thrombus. Normal compressibility, respiratory phasicity and response to augmentation. Basilic Vein: No evidence of thrombus. Normal compressibility, respiratory phasicity and response to augmentation. Brachial Veins: No evidence of thrombus. Normal compressibility, respiratory phasicity and response to augmentation. Radial Veins: No evidence of thrombus. Normal compressibility, respiratory phasicity and response to augmentation. Ulnar Veins: No evidence of thrombus. Normal compressibility, respiratory phasicity and response to augmentation. Venous Reflux:  None visualized. Other Findings:  None visualized. IMPRESSION: No evidence of deep venous thrombosis right upper extremity. Electronically Signed   By: Lahoma Crocker M.D.   On: 02/02/2015 14:58    Labs: Basic Metabolic Panel:  Recent Labs Lab 02/10/15 1626 02/11/15 0309 02/12/15 0319 02/13/15 0322 02/14/15 0109  NA 135 135 135 135 135  K 4.5 3.9 4.0 5.2* 3.5  CL 103 106 101 100* 97*  CO2 21* 22 22 23 29   GLUCOSE 127* 131* 142* 180* 184*  BUN 60*  61* 61* 61* 62*  CREATININE 2.26* 2.39* 2.44* 2.38* 2.41*  CALCIUM 8.7* 8.4* 8.7* 8.5* 8.5*   Liver Function Tests:  Recent Labs Lab 02/10/15 1805  AST 29  ALT 21  ALKPHOS 65  BILITOT 0.9  PROT 6.0*  ALBUMIN 2.9*   CBC:  Recent Labs Lab 02/10/15 1626  WBC 4.4  HGB 9.7*  HCT 30.5*  MCV 86.9  PLT 184   Cardiac Enzymes:  Recent Labs Lab 02/10/15 2156 02/11/15 0309 02/11/15 0850  TROPONINI <0.03 <0.03 <0.03   BNP: BNP (last 3 results)  Recent Labs  02/10/15 1820  BNP 658.7*   CBG:  Recent Labs Lab 02/13/15 1130 02/13/15 1604 02/13/15 2056 02/14/15 0558 02/14/15 1115  GLUCAP 174* 202* 166* 133* 235*    Signed:  Barton Dubois  [facility] 02/14/2015, 1:44 PM

## 2015-03-11 ENCOUNTER — Ambulatory Visit (INDEPENDENT_AMBULATORY_CARE_PROVIDER_SITE_OTHER): Payer: Medicare Other | Admitting: *Deleted

## 2015-03-11 DIAGNOSIS — I495 Sick sinus syndrome: Secondary | ICD-10-CM

## 2015-03-12 NOTE — Progress Notes (Signed)
Remote pacemaker transmission.   

## 2015-03-14 ENCOUNTER — Encounter: Payer: Self-pay | Admitting: Internal Medicine

## 2015-03-22 ENCOUNTER — Ambulatory Visit (INDEPENDENT_AMBULATORY_CARE_PROVIDER_SITE_OTHER): Payer: Medicare Other | Admitting: *Deleted

## 2015-03-22 ENCOUNTER — Encounter: Payer: Self-pay | Admitting: Internal Medicine

## 2015-03-22 DIAGNOSIS — I482 Chronic atrial fibrillation, unspecified: Secondary | ICD-10-CM

## 2015-03-22 DIAGNOSIS — I495 Sick sinus syndrome: Secondary | ICD-10-CM | POA: Diagnosis not present

## 2015-03-22 LAB — CUP PACEART INCLINIC DEVICE CHECK
Battery Remaining Longevity: 97.2
Brady Statistic RA Percent Paced: 27 %
Brady Statistic RV Percent Paced: 8.2 %
Implantable Lead Implant Date: 20130206
Implantable Lead Implant Date: 20130206
Implantable Lead Location: 753860
Lead Channel Impedance Value: 362.5 Ohm
Lead Channel Impedance Value: 412.5 Ohm
Lead Channel Pacing Threshold Amplitude: 1 V
Lead Channel Pacing Threshold Pulse Width: 0.4 ms
Lead Channel Pacing Threshold Pulse Width: 0.4 ms
Lead Channel Pacing Threshold Pulse Width: 0.4 ms
Lead Channel Sensing Intrinsic Amplitude: 0.6 mV
Lead Channel Setting Pacing Amplitude: 2.5 V
MDC IDC LEAD LOCATION: 753859
MDC IDC MSMT BATTERY VOLTAGE: 2.93 V
MDC IDC MSMT LEADCHNL RA PACING THRESHOLD AMPLITUDE: 0 V
MDC IDC MSMT LEADCHNL RV PACING THRESHOLD AMPLITUDE: 1 V
MDC IDC MSMT LEADCHNL RV SENSING INTR AMPL: 7.6 mV
MDC IDC PG SERIAL: 7317892
MDC IDC SESS DTM: 20161212151048
MDC IDC SET LEADCHNL RA PACING AMPLITUDE: 2 V
MDC IDC SET LEADCHNL RV PACING PULSEWIDTH: 0.4 ms
MDC IDC SET LEADCHNL RV SENSING SENSITIVITY: 2 mV

## 2015-03-22 NOTE — Progress Notes (Signed)
Pacemaker check in clinic. Normal device function. Threshold, sensing, impedances consistent with previous measurements. Device programmed to maximize longevity. (69%) AF burden, presently in AFL + Amio 100/warfarin.  No high ventricular rates noted. Device programmed at appropriate safety margins. Histogram distribution appropriate for patient activity level. Device programmed to optimize intrinsic conduction. Estimated longevity 7.3-8.1 years. Patient will follow up with GT on 12/13 @ 1045 (per pt/family request- pt c/o fatigue and increased ShOB).

## 2015-03-23 ENCOUNTER — Encounter: Payer: Self-pay | Admitting: Internal Medicine

## 2015-03-23 ENCOUNTER — Ambulatory Visit (INDEPENDENT_AMBULATORY_CARE_PROVIDER_SITE_OTHER): Payer: Medicare Other | Admitting: Internal Medicine

## 2015-03-23 VITALS — BP 88/58 | HR 88 | Ht 60.0 in | Wt 140.0 lb

## 2015-03-23 DIAGNOSIS — I5033 Acute on chronic diastolic (congestive) heart failure: Secondary | ICD-10-CM

## 2015-03-23 DIAGNOSIS — Z79899 Other long term (current) drug therapy: Secondary | ICD-10-CM | POA: Diagnosis not present

## 2015-03-23 DIAGNOSIS — I480 Paroxysmal atrial fibrillation: Secondary | ICD-10-CM | POA: Diagnosis not present

## 2015-03-23 DIAGNOSIS — I150 Renovascular hypertension: Secondary | ICD-10-CM

## 2015-03-23 DIAGNOSIS — Z950 Presence of cardiac pacemaker: Secondary | ICD-10-CM

## 2015-03-23 MED ORDER — TORSEMIDE 20 MG PO TABS: 20.0000 mg | ORAL_TABLET | Freq: Two times a day (BID) | ORAL | Status: AC

## 2015-03-23 NOTE — Assessment & Plan Note (Signed)
Her St. Jude DDD PM is working normally. Will recheck in several months.  

## 2015-03-23 NOTE — Patient Instructions (Addendum)
Your physician wants you to follow-up in: January with Dr.Taylor.  If you don't receive a letter, please call our office to schedule the follow-up appointment.  Remote monitoring is used to monitor your Pacemaker of ICD from home. This monitoring reduces the number of office visits required to check your device to one time per year. It allows [place] to keep an eye on the functioning of your device to ensure it is working properly. You are scheduled for a device check from home on 06/22/15. You may send your transmission at any time that day. If you have a wireless device, the transmission will be sent automatically. After your physician reviews your transmission, you will receive a postcard with your next transmission date.  Your physician has recommended you make the following change in your medication:  Increase Torsemide to 20 mg Two Times Daily  Your physician recommends that you return for lab work in: 1 Week (BMP)     If you need a refill on your cardiac medications before your next appointment, please call your pharmacy.  Thank you for choosing [facility]!

## 2015-03-23 NOTE — Progress Notes (Signed)
HPI Isabella Chen returns today for followup. She is a pleasant elderly woman with a h/o symptomatic bradycardia, s/p PPM insertion. The patient was seen by me last several months ago and had shingles. She has had problems with volume overload and worsening renal insufficiency. She now c/o weeping of her right arm and skin lesions there and on the right side of her chest. She is not more sob but does also note more swelling in her lower legs. No fever or chills. She does have pain over her lesions.  Allergies  Allergen Reactions  . Other     Muscle relaxer says it made her" fall in the floor" "took my legs away"     Current Outpatient Prescriptions  Medication Sig Dispense Refill  . amiodarone (PACERONE) 100 MG tablet TAKE 1 TABLET (100 MG TOTAL) BY MOUTH DAILY. (Patient taking differently: TAKE 1 TABLET (100 MG TOTAL) BY MOUTH DAILY AT BEDTIME) 90 tablet 3  . feeding supplement, ENSURE ENLIVE, (ENSURE ENLIVE) LIQD Take 237 mLs by mouth daily at 2 PM daily at 2 PM.    . fish oil-omega-3 fatty acids 1000 MG capsule Take 2 g by mouth daily.    . insulin detemir (LEVEMIR) 100 UNIT/ML injection Inject 0.1 mLs (10 Units total) into the skin daily.    . meclizine (ANTIVERT) 25 MG tablet Take 25 mg by mouth 2 (two) times daily as needed for dizziness.     . metoprolol succinate (TOPROL-XL) 50 MG 24 hr tablet Take 1 tablet (50 mg total) by mouth daily. Take with or immediately following a meal. 30 tablet 6  . Multiple Vitamins-Minerals (PRESERVISION AREDS 2 PO) Take 2 capsules by mouth daily.    . pantoprazole (PROTONIX) 40 MG tablet Take 40 mg by mouth daily.    . rosuvastatin (CRESTOR) 5 MG tablet Take 5 mg by mouth daily.    Marland Kitchen torsemide (DEMADEX) 20 MG tablet Take 1.5 tablets (30 mg total) by mouth daily. 45 tablet 1  . warfarin (COUMADIN) 3 MG tablet Take 3-4.5 mg by mouth daily at 6 PM. Takes 3 mg daily except 1.5 tablet ( 4.5 mg) on Thursdays and Sundays     No current facility-administered  medications for this visit.     Past Medical History  Diagnosis Date  . Sick sinus syndrome (HCC)     Atrial fibrillation + bradycardia; St. Jude dual-chamber device in 2013; normal EF + mild to moderate LVH on echocardiogram  . Hypertension   . Pulmonary hypertension (HCC)     Estimated PA pressure of 50 mmHg  . Presence of permanent cardiac pacemaker   . Hyperlipidemia   . Type II diabetes mellitus ([place])   . Migraine     "none in years; had 3 spells w/bright, flickering lights in June 2016; no pain; they were just like I had migraines but without the awful pain" (02/10/2015)  . Osteoarthritis of both knees   . Rheumatoid arthritis involving both hands ([place])   . Cancer of right breast ([place])   . Urinary incontinence   . Macular degeneration of both eyes     ROS:   All systems reviewed and negative except as noted in the HPI.   Past Surgical History  Procedure Laterality Date  . Bowel resection      "I don't remember that but I have had 3 ORs in my abdomen" (02/11/2015)  . Ovarian cyst removal Left   . Tonsillectomy    . A-v cardiac pacemaker insertion  05/2011  St. Jude Accent DR dual-chamber pacemaker  . Permanent pacemaker insertion N/A 05/17/2011    Procedure: PERMANENT PACEMAKER INSERTION;  Surgeon: Evans Lance, MD;  Location: [facility] C[facility] LAB;  Service: Cardiovascular;  Laterality: N/A;  . Abdominal hysterectomy    . Breast biopsy Right   . Mastectomy Right   . Eye surgery Bilateral     "Dr. Katy Fitch operated on them; I don't know what he did" (02/10/2015)  . Orif distal radius fracture Right 10/2003    Archie Endo 10/19/2012  . Wrist fracture surgery Left     "they had to put it together w/metal"  . Fracture surgery    . Thigh / knee soft tissue biopsy Right ~ 2014    "for knots in both thighs; mainly on the right; not cancer"     No family history on file.   Social History   Social History  . Marital Status: Widowed    Spouse Name: N/A  . Number of Children: N/A   . Years of Education: N/A   Occupational History  . Not on file.   Social History Main Topics  . Smoking status: Never Smoker   . Smokeless tobacco: Never Used  . Alcohol Use: No  . Drug Use: No  . Sexual Activity: No   Other Topics Concern  . Not on file   Social History Narrative     BP 88/58 mmHg  Pulse 88  Ht 5' (1.524 m)  Wt 140 lb (63.504 kg)  BMI 27.34 kg/m2  Physical Exam:  Chronically ill appearing elderly woman, NAD HEENT: Unremarkable Neck:  7 cm JVD, no thyromegally Lungs:  Scattered basilar rales with no wheezes, or rhonchi HEART:  Regular rate rhythm, 3/6 systolic murmur consistent with MR, no rubs, no clicks Abd:  soft, positive bowel sounds, no organomegally, no rebound, no guarding Ext:  2 plus pulses, 1+ peripheral edema in legs, 3+ edema in right arm, none in left arm, no cyanosis, no clubbing Skin:  Multiple erytematous shallow ulcerated macular lesions near right elbow on lateral/posterior side and lesion on right side of the chest. Neuro:  CN II through XII intact, motor grossly intact   DEVICE  Normal device function.  See PaceArt for details.   Assess/Plan:

## 2015-03-23 NOTE — Assessment & Plan Note (Signed)
Her ventricular rate is controlled. She will continue warfarin for now.

## 2015-03-23 NOTE — Assessment & Plan Note (Signed)
She appears to have worsened. I am concerned about her kidney function but will give additional demadex. From 30 mg daily to 20 mg bid for a week. Will have her back for a BMP in a week.

## 2015-03-23 NOTE — Assessment & Plan Note (Signed)
Her blood pressure has been on the low side. Will follow.

## 2015-03-24 LAB — CUP PACEART REMOTE DEVICE CHECK
Battery Remaining Percentage: 73 %
Battery Voltage: 2.92 V
Brady Statistic AP VS Percent: 86 %
Brady Statistic AS VS Percent: 13 %
Date Time Interrogation Session: 20161201070008
Implantable Lead Implant Date: 20130206
Implantable Lead Location: 753859
Lead Channel Impedance Value: 380 Ohm
Lead Channel Pacing Threshold Amplitude: 0.75 V
Lead Channel Pacing Threshold Amplitude: 1 V
Lead Channel Pacing Threshold Pulse Width: 0.4 ms
Lead Channel Sensing Intrinsic Amplitude: 1 mV
Lead Channel Setting Pacing Amplitude: 5 V
Lead Channel Setting Pacing Pulse Width: 0.4 ms
Lead Channel Setting Sensing Sensitivity: 2 mV
MDC IDC LEAD IMPLANT DT: 20130206
MDC IDC LEAD LOCATION: 753860
MDC IDC MSMT BATTERY REMAINING LONGEVITY: 65 mo
MDC IDC MSMT LEADCHNL RA PACING THRESHOLD PULSEWIDTH: 0.4 ms
MDC IDC MSMT LEADCHNL RV IMPEDANCE VALUE: 430 Ohm
MDC IDC MSMT LEADCHNL RV SENSING INTR AMPL: 7.4 mV
MDC IDC PG SERIAL: 7317892
MDC IDC SET LEADCHNL RA PACING AMPLITUDE: 2 V
MDC IDC STAT BRADY AP VP PERCENT: 1 %
MDC IDC STAT BRADY AS VP PERCENT: 1 %
MDC IDC STAT BRADY RA PERCENT PACED: 31 %
MDC IDC STAT BRADY RV PERCENT PACED: 8.4 %

## 2015-03-25 ENCOUNTER — Other Ambulatory Visit: Payer: Self-pay | Admitting: Internal Medicine

## 2015-03-26 ENCOUNTER — Encounter: Payer: Self-pay | Admitting: Cardiology

## 2015-03-28 ENCOUNTER — Emergency Department (HOSPITAL_COMMUNITY): Payer: Medicare Other

## 2015-03-28 ENCOUNTER — Emergency Department (HOSPITAL_COMMUNITY)
Admission: EM | Admit: 2015-03-28 | Discharge: 2015-04-11 | Disposition: E | Payer: Medicare Other | Attending: Emergency Medicine | Admitting: Emergency Medicine

## 2015-03-28 DIAGNOSIS — Z8739 Personal history of other diseases of the musculoskeletal system and connective tissue: Secondary | ICD-10-CM | POA: Diagnosis not present

## 2015-03-28 DIAGNOSIS — Z950 Presence of cardiac pacemaker: Secondary | ICD-10-CM | POA: Diagnosis not present

## 2015-03-28 DIAGNOSIS — Z8669 Personal history of other diseases of the nervous system and sense organs: Secondary | ICD-10-CM | POA: Insufficient documentation

## 2015-03-28 DIAGNOSIS — R4182 Altered mental status, unspecified: Secondary | ICD-10-CM | POA: Diagnosis present

## 2015-03-28 DIAGNOSIS — Z794 Long term (current) use of insulin: Secondary | ICD-10-CM | POA: Insufficient documentation

## 2015-03-28 DIAGNOSIS — I10 Essential (primary) hypertension: Secondary | ICD-10-CM | POA: Insufficient documentation

## 2015-03-28 DIAGNOSIS — I4891 Unspecified atrial fibrillation: Secondary | ICD-10-CM | POA: Diagnosis not present

## 2015-03-28 DIAGNOSIS — N179 Acute kidney failure, unspecified: Secondary | ICD-10-CM | POA: Insufficient documentation

## 2015-03-28 DIAGNOSIS — Z79899 Other long term (current) drug therapy: Secondary | ICD-10-CM | POA: Insufficient documentation

## 2015-03-28 DIAGNOSIS — E119 Type 2 diabetes mellitus without complications: Secondary | ICD-10-CM | POA: Insufficient documentation

## 2015-03-28 DIAGNOSIS — E785 Hyperlipidemia, unspecified: Secondary | ICD-10-CM | POA: Diagnosis not present

## 2015-03-28 DIAGNOSIS — I639 Cerebral infarction, unspecified: Secondary | ICD-10-CM | POA: Diagnosis not present

## 2015-03-28 DIAGNOSIS — Z853 Personal history of malignant neoplasm of breast: Secondary | ICD-10-CM | POA: Insufficient documentation

## 2015-03-28 DIAGNOSIS — A419 Sepsis, unspecified organism: Secondary | ICD-10-CM | POA: Diagnosis not present

## 2015-03-28 DIAGNOSIS — I509 Heart failure, unspecified: Secondary | ICD-10-CM | POA: Diagnosis not present

## 2015-03-28 DIAGNOSIS — Z7901 Long term (current) use of anticoagulants: Secondary | ICD-10-CM | POA: Diagnosis not present

## 2015-03-28 DIAGNOSIS — G43909 Migraine, unspecified, not intractable, without status migrainosus: Secondary | ICD-10-CM | POA: Insufficient documentation

## 2015-03-28 LAB — I-STAT CG4 LACTIC ACID, ED: LACTIC ACID, VENOUS: 5.74 mmol/L — AB (ref 0.5–2.0)

## 2015-03-28 LAB — CBC WITH DIFFERENTIAL/PLATELET
BASOS ABS: 0 10*3/uL (ref 0.0–0.1)
BASOS PCT: 0 %
EOS ABS: 0 10*3/uL (ref 0.0–0.7)
EOS PCT: 0 %
HCT: 33 % — ABNORMAL LOW (ref 36.0–46.0)
Hemoglobin: 10.1 g/dL — ABNORMAL LOW (ref 12.0–15.0)
LYMPHS PCT: 4 %
Lymphs Abs: 0.8 10*3/uL (ref 0.7–4.0)
MCH: 26.9 pg (ref 26.0–34.0)
MCHC: 30.6 g/dL (ref 30.0–36.0)
MCV: 87.8 fL (ref 78.0–100.0)
MONO ABS: 0.7 10*3/uL (ref 0.1–1.0)
Monocytes Relative: 4 %
Neutro Abs: 16.8 10*3/uL — ABNORMAL HIGH (ref 1.7–7.7)
Neutrophils Relative %: 92 %
PLATELETS: 270 10*3/uL (ref 150–400)
RBC: 3.76 MIL/uL — AB (ref 3.87–5.11)
RDW: 19 % — AB (ref 11.5–15.5)
WBC: 18.3 10*3/uL — AB (ref 4.0–10.5)

## 2015-03-28 LAB — URINALYSIS, ROUTINE W REFLEX MICROSCOPIC
Bilirubin Urine: NEGATIVE
Glucose, UA: NEGATIVE mg/dL
Hgb urine dipstick: NEGATIVE
KETONES UR: NEGATIVE mg/dL
NITRITE: NEGATIVE
PROTEIN: 30 mg/dL — AB
Specific Gravity, Urine: 1.016 (ref 1.005–1.030)
pH: 5.5 (ref 5.0–8.0)

## 2015-03-28 LAB — I-STAT CHEM 8, ED
BUN: >140 mg/dL — ABNORMAL HIGH (ref 6–20)
CALCIUM ION: 0.93 mmol/L — AB (ref 1.13–1.30)
CREATININE: 5.3 mg/dL — AB (ref 0.44–1.00)
Chloride: 102 mmol/L (ref 101–111)
GLUCOSE: 90 mg/dL (ref 65–99)
HCT: 36 % (ref 36.0–46.0)
HEMOGLOBIN: 12.2 g/dL (ref 12.0–15.0)
Potassium: 5.6 mmol/L — ABNORMAL HIGH (ref 3.5–5.1)
Sodium: 131 mmol/L — ABNORMAL LOW (ref 135–145)
TCO2: 14 mmol/L (ref 0–100)

## 2015-03-28 LAB — I-STAT TROPONIN, ED: TROPONIN I, POC: 0.03 ng/mL (ref 0.00–0.08)

## 2015-03-28 LAB — POC OCCULT BLOOD, ED: FECAL OCCULT BLD: NEGATIVE

## 2015-03-28 LAB — URINE MICROSCOPIC-ADD ON

## 2015-03-28 LAB — CBG MONITORING, ED: Glucose-Capillary: 95 mg/dL (ref 65–99)

## 2015-03-28 MED ORDER — DICYCLOMINE HCL 20 MG PO TABS: 20.0000 mg | ORAL_TABLET | Freq: Two times a day (BID) | ORAL | Status: AC

## 2015-03-28 MED ORDER — SODIUM CHLORIDE 0.9 % IV BOLUS (SEPSIS)
1000.0000 mL | Freq: Once | INTRAVENOUS | Status: AC
  Administered 2015-03-28: 1000 mL via INTRAVENOUS

## 2015-03-28 MED ORDER — EPINEPHRINE HCL 0.1 MG/ML IJ SOSY
PREFILLED_SYRINGE | INTRAMUSCULAR | Status: AC | PRN
  Administered 2015-03-28 (×3): 1 via INTRAVENOUS

## 2015-03-28 MED FILL — Medication: Qty: 1 | Status: AC

## 2015-03-30 LAB — URINE CULTURE

## 2015-04-11 NOTE — Code Documentation (Signed)
Bedside US done, no cardiac activity noted

## 2015-04-11 NOTE — Code Documentation (Signed)
Family at beside. Family given emotional support. 

## 2015-04-11 NOTE — ED Notes (Addendum)
To ED via [facility] for eval of ALOC for the past 3 days. Per report, EMS was called by pt's son who stated pt has been altered for the past 3 days. [facility] has been to the home without reports of pt being altered, per report from ems. Extremity swelling noted. BLE purple at feet and mottled from knees down. Pedal pulses dopplered. NRB placed and pt became more responsive. Moving arms and trying to remove NRB. EDP in for eval and pt following a few commands... Sticks tongue out and opens mouth. Initial CBG by EMS 50, treated by ems.

## 2015-04-11 NOTE — ED Notes (Signed)
While packaging pt up for transport to CT pt became unresponsive and stopped breathing. Pulseless and apneic. Assisted ventilations with BVM and CPR started. Dr Jeneen Rinks called to the room. RN's x2 in room and EMT in room. Family at bedside

## 2015-04-11 NOTE — ED Notes (Signed)
Pt attempting to pull off NRB and moaning. Changing to [place]

## 2015-04-11 NOTE — Code Documentation (Signed)
Rt and Dr Jeneen Rinks at bedside.

## 2015-04-11 NOTE — ED Notes (Signed)
Family at bedside. EDP to update them on poc. Per family, pt's baseline is that she is verbal and attempts to walk around. For the past 1.5 days pt has 'been declining'.

## 2015-04-11 NOTE — ED Notes (Signed)
Family at bedside. 

## 2015-04-11 NOTE — ED Notes (Signed)
Patient placement aware pt being moved to the morgue

## 2015-04-11 NOTE — ED Provider Notes (Addendum)
CSN: [id number]     Arrival date & time 04/18/2015  1348 History   First MD Initiated Contact with Patient 2015-04-18 1357     Chief Complaint  Patient presents with  . Altered Mental Status      HPI  Patient presents for evaluation of decreased level of consciousness.  82 year old female with a history of atrial fibrillation on amiodarone and Coumadin, renal insufficiency, congestive heart failure, hypertension.  Has had increasing occult congestive heart failure since an admission several weeks ago. Her INR got high. According to husband she was asked to hold her Coumadin for 24 hours and then stop at a lower dose the next day. Seen by her cardiologist 5 or 6 days ago. Torsemide was increased because of increasing edema and "blistering" of her skin.  Last 3 days has had a decreasing energy at home. According to the husband she quit talking yesterday midday. Was unable to get herself out of bed this afternoon so she was brought by [facility] to our facility.  Past Medical History  Diagnosis Date  . Sick sinus syndrome (HCC)     Atrial fibrillation + bradycardia; St. Jude dual-chamber device in 2013; normal EF + mild to moderate LVH on echocardiogram  . Hypertension   . Pulmonary hypertension (HCC)     Estimated PA pressure of 50 mmHg  . Presence of permanent cardiac pacemaker   . Hyperlipidemia   . Type II diabetes mellitus ([place])   . Migraine     "none in years; had 3 spells w/bright, flickering lights in June 2016; no pain; they were just like I had migraines but without the awful pain" (02/10/2015)  . Osteoarthritis of both knees   . Rheumatoid arthritis involving both hands ([place])   . Cancer of right breast ([place])   . Urinary incontinence   . Macular degeneration of both eyes    Past Surgical History  Procedure Laterality Date  . Bowel resection      "I don't remember that but I have had 3 ORs in my abdomen" (02/11/2015)  . Ovarian cyst removal Left   .  Tonsillectomy    . A-v cardiac pacemaker insertion  05/2011    St. Jude Accent DR dual-chamber pacemaker  . Permanent pacemaker insertion N/A 05/17/2011    Procedure: PERMANENT PACEMAKER INSERTION;  Surgeon: Evans Lance, MD;  Location: [facility] CATH LAB;  Service: Cardiovascular;  Laterality: N/A;  . Abdominal hysterectomy    . Breast biopsy Right   . Mastectomy Right   . Eye surgery Bilateral     "Dr. Katy Fitch operated on them; I don't know what he did" (02/10/2015)  . Orif distal radius fracture Right 10/2003    Archie Endo 10/19/2012  . Wrist fracture surgery Left     "they had to put it together w/metal"  . Fracture surgery    . Thigh / knee soft tissue biopsy Right ~ 2014    "for knots in both thighs; mainly on the right; not cancer"   No family history on file. Social History  Substance Use Topics  . Smoking status: Never Smoker   . Smokeless tobacco: Never Used  . Alcohol Use: No   OB History    No data available     Review of Systems  Unable to perform ROS: Acuity of condition      Allergies  Other  Home Medications   Prior to Admission medications   Medication Sig Start Date End Date Taking? Authorizing Provider  amiodarone (PACERONE) 100 MG tablet TAKE 1 TABLET BY MOUTH EVERY DAY 03/25/15   Evans Lance, MD  feeding supplement, ENSURE ENLIVE, (ENSURE ENLIVE) LIQD Take 237 mLs by mouth daily at 2 PM daily at 2 PM. 02/14/15   Barton Dubois, MD  fish oil-omega-3 fatty acids 1000 MG capsule Take 2 g by mouth daily.    Historical Provider, MD  insulin detemir (LEVEMIR) 100 UNIT/ML injection Inject 0.1 mLs (10 Units total) into the skin daily. 02/14/15   Barton Dubois, MD  meclizine (ANTIVERT) 25 MG tablet Take 25 mg by mouth 2 (two) times daily as needed for dizziness.     Historical Provider, MD  metoprolol succinate (TOPROL-XL) 50 MG 24 hr tablet Take 1 tablet (50 mg total) by mouth daily. Take with or immediately following a meal. 02/05/12   Yehuda Savannah, MD  Multiple  Vitamins-Minerals (PRESERVISION AREDS 2 PO) Take 2 capsules by mouth daily.    Historical Provider, MD  pantoprazole (PROTONIX) 40 MG tablet Take 40 mg by mouth daily. 01/12/15   Historical Provider, MD  rosuvastatin (CRESTOR) 5 MG tablet Take 5 mg by mouth daily.    Historical Provider, MD  torsemide (DEMADEX) 20 MG tablet Take 1 tablet (20 mg total) by mouth 2 (two) times daily. 03/23/15   Evans Lance, MD  warfarin (COUMADIN) 3 MG tablet Take 3-4.5 mg by mouth daily at 6 PM. Takes 3 mg daily except 1.5 tablet ( 4.5 mg) on Thursdays and Sundays    Historical Provider, MD   BP 114/98 mmHg  Pulse 83  Temp(Src) 94.1 F (34.5 C) (Core (Comment))  SpO2 100% Physical Exam  Constitutional: She appears lethargic. She has a sickly appearance.  Chronic and acutely ill appearing 82 year old female. Nonverbal. Eyes intermittently open.  HENT:  No sign of trauma. No blood over the TMs, mastoids, or ears nose or mouth. Pupils 3 mm reactive.  Eyes:  Symmetric, and reactive.  Neck:  No JVD  Cardiovascular: S1 normal and S2 normal.   Pulmonary/Chest:    No crackles. Overall fair air movement. Increased work of breathing.  Abdominal:  Obese, benign.  Neurological: She appears lethargic.  Aphasic. Incomprehensible sounds. We'll protrude her tongue when asked. Otherwise will not follow commands.    ED Course  Procedures (including critical care time) Labs Review Labs Reviewed  CBC WITH DIFFERENTIAL/PLATELET - Abnormal; Notable for the following:    WBC 18.3 (*)    RBC 3.76 (*)    Hemoglobin 10.1 (*)    HCT 33.0 (*)    RDW 19.0 (*)    Neutro Abs 16.8 (*)    All other components within normal limits  URINALYSIS, ROUTINE W REFLEX MICROSCOPIC (NOT AT [facility]) - Abnormal; Notable for the following:    APPearance CLOUDY (*)    Protein, ur 30 (*)    Leukocytes, UA TRACE (*)    All other components within normal limits  URINE MICROSCOPIC-ADD ON - Abnormal; Notable for the following:    Squamous  Epithelial / LPF 0-5 (*)    Bacteria, UA MANY (*)    All other components within normal limits  I-STAT CG4 LACTIC ACID, ED - Abnormal; Notable for the following:    Lactic Acid, Venous 5.74 (*)    All other components within normal limits  I-STAT CHEM 8, ED - Abnormal; Notable for the following:    Sodium 131 (*)    Potassium 5.6 (*)    BUN >140 (*)    Creatinine,  Ser 5.30 (*)    Calcium, Ion 0.93 (*)    All other components within normal limits  CULTURE, BLOOD (ROUTINE X 2)  CULTURE, BLOOD (ROUTINE X 2)  URINE CULTURE  COMPREHENSIVE METABOLIC PANEL  PROTIME-INR  CBG MONITORING, ED  I-STAT TROPOININ, ED  POC OCCULT BLOOD, ED  TYPE AND SCREEN    Imaging Review Dg Chest Port 1 View  04-26-2015  CLINICAL DATA:  Hypoxia EXAM: PORTABLE CHEST 1 VIEW COMPARISON:  02/10/2015 FINDINGS: Moderate cardiomegaly. Dual lead left subclavian pacemaker device and leads are stable. Moderate right pleural effusion has enlarged. Small left pleural effusion. There is associated dependent atelectasis. No pneumothorax. IMPRESSION: Bilateral pleural effusions have increased right greater than left. There is associated bibasilar atelectasis. Electronically Signed   By: Marybelle Killings M.D.   On: April 26, 2015 14:44   I have personally reviewed and evaluated these images and lab results as part of my medical decision-making.   EKG Interpretation None      MDM   Final diagnoses:  Cerebrovascular accident (CVA), unspecified mechanism ([place])  Sepsis, due to unspecified organism ([place])  AKI (acute kidney injury) ([place])  Congestive heart failure, unspecified congestive heart failure chronicity, unspecified congestive heart failure type [facility])     The concern is with her recent coagulopathy that she may have had a CNS hemorrhage. She is hypothermic at 34.2. Foley catheter placed. Placed on bear hugger. Lactic acidosis of 5.74. Sodium 131. K.56. Crit 5.3, baseline less than 3. Leukocytosis of 18.3. EKG was without  acute findings.  Duplex ordered an fluid bolus ordered. Patient became hypotensive at 50. Pressure bag placed on fluids. Her mental status dysuria. She became unresponsive. Recheck blood sugar 96. Became pulseless and apneic. He was bag assisted. Monitor showed asystole. Given multiple dose IV epinephrine and CPR. After multiple pulse checks assisted efforts were terminated. Family was present for her arrest attempt resuscitation. Initially stated that with her previous admission she "wanted everything done". As I spoke with him during the attempted resuscitation, and her failure to recover spontaneous circulation and they requested that her efforts be terminated.  CRITICAL CARE Performed by: Tanna Furry JOSEPH   Total critical care time: 30 minutes  Critical care time was exclusive of separately billable procedures and treating other patients.  Critical care was necessary to treat or prevent imminent or life-threatening deterioration.  Critical care was time spent personally by me on the following activities: development of treatment plan with patient and/or surrogate as well as nursing, discussions with consultants, evaluation of patient's response to treatment, examination of patient, obtaining history from patient or surrogate, ordering and performing treatments and interventions, ordering and review of laboratory studies, ordering and review of radiographic studies, pulse oximetry and re-evaluation of patient's condition. ca   Tanna Furry, MD April 26, 2015 1557  Tanna Furry, MD 2015-04-26 [phone]

## 2015-04-11 DEATH — deceased

## 2015-05-07 ENCOUNTER — Encounter: Payer: Medicare Other | Admitting: Internal Medicine

## 2016-06-26 IMAGING — CT CT HEAD W/O CM
1 of 2 series · 13 of 30 positions shown, 17 images · non-contrast
Comparison: October 19, 2009

CLINICAL DATA: Patient lost balance and fell. Altered mental status
; hyperglycemia

EXAM:
CT HEAD WITHOUT CONTRAST
TECHNIQUE: Contiguous axial images were obtained from the base of the skull
through the vertex without intravenous contrast.

[Series 2: headtrauma 4.8 h37s · axial · 0.43mm/px · z∈[+1278,+1428]mm · 13 of 36 slices shown, 17 images]
[im 3/36  brain]
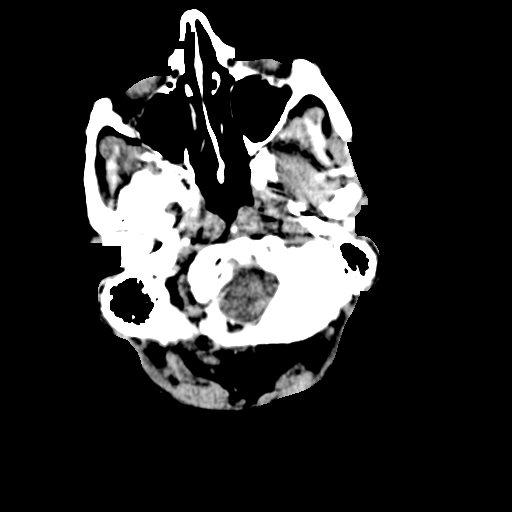
[im 3/36  bone]
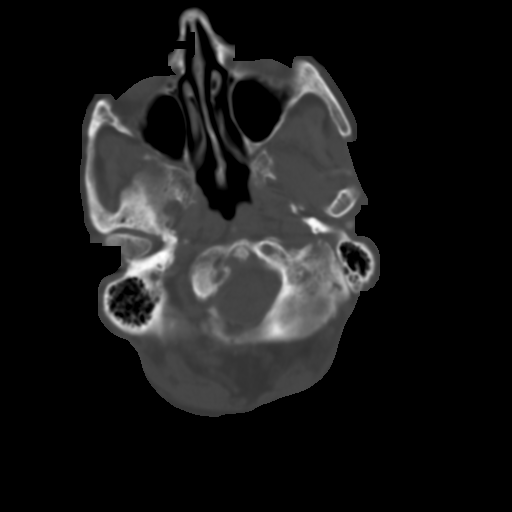
[im 6/36  brain]
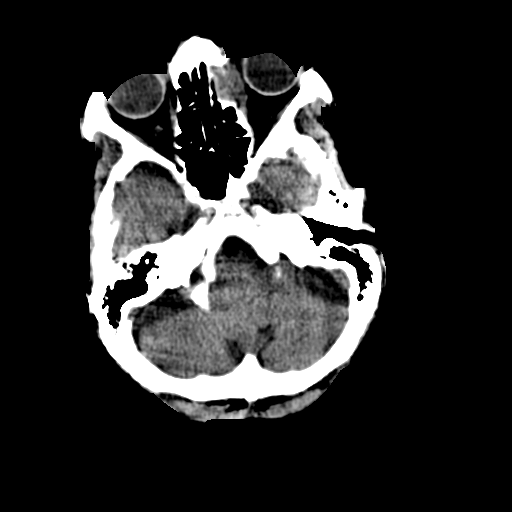
[im 8/36  brain]
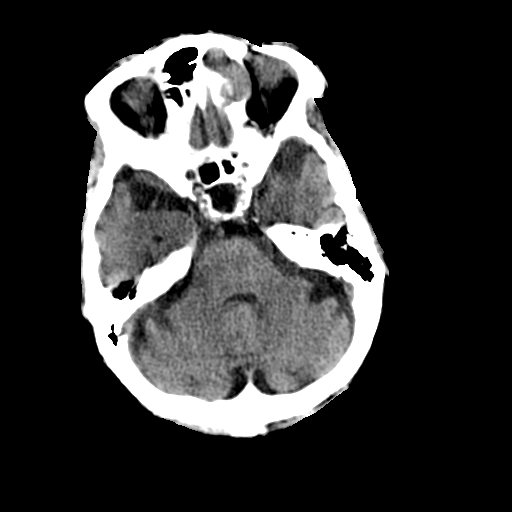
[im 11/36  brain]
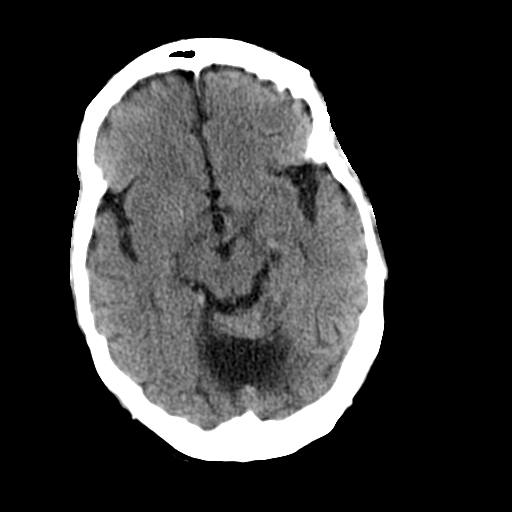
[im 13/36  brain]
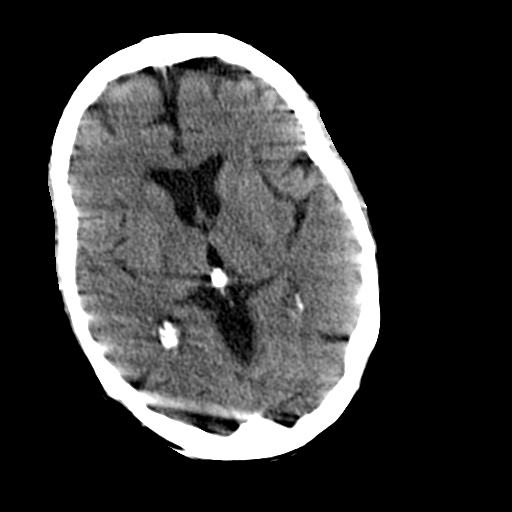
[im 13/36  bone]
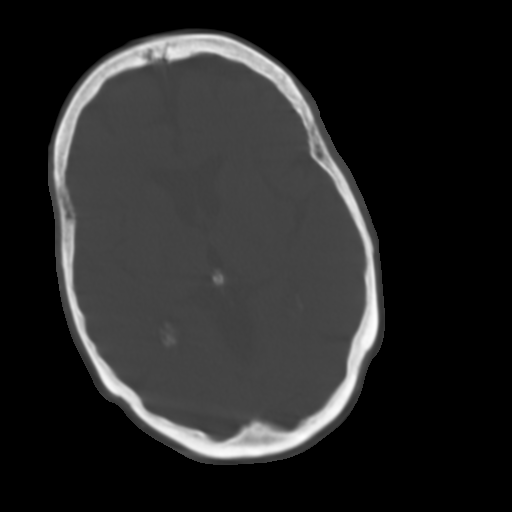
[im 16/36  brain]
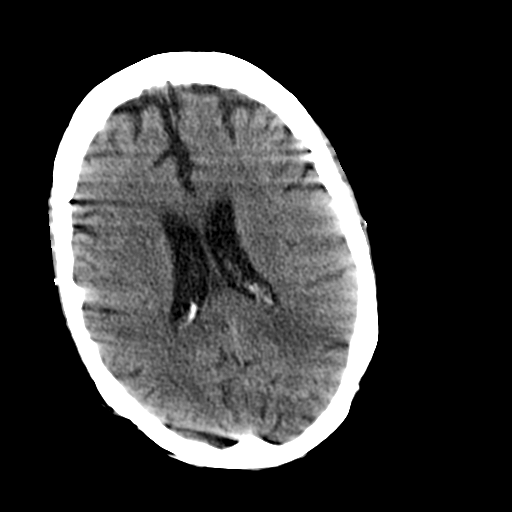
[im 18/36  brain]
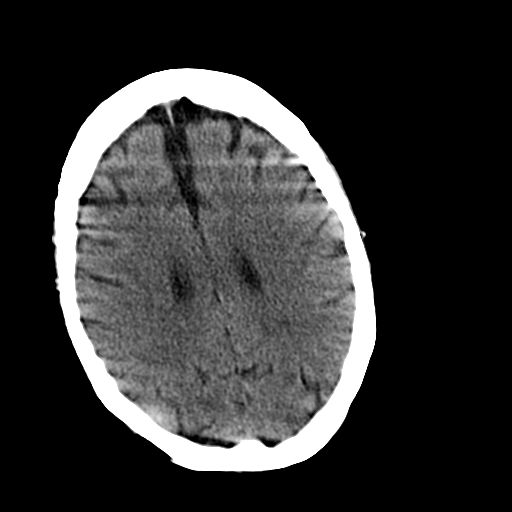
[im 21/36  brain]
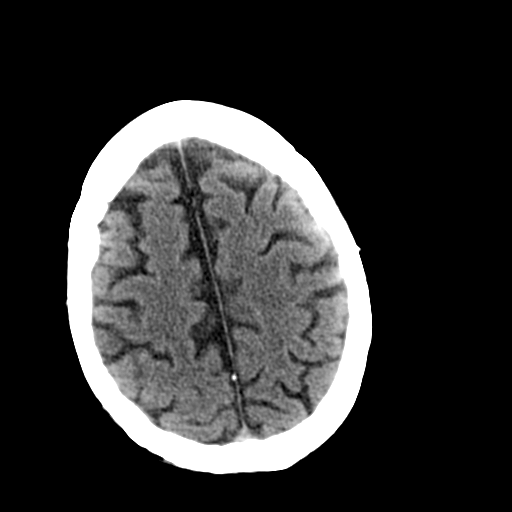
[im 23/36  brain]
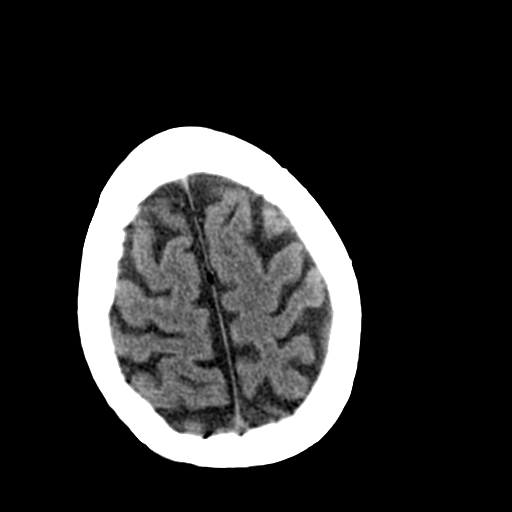
[im 23/36  bone]
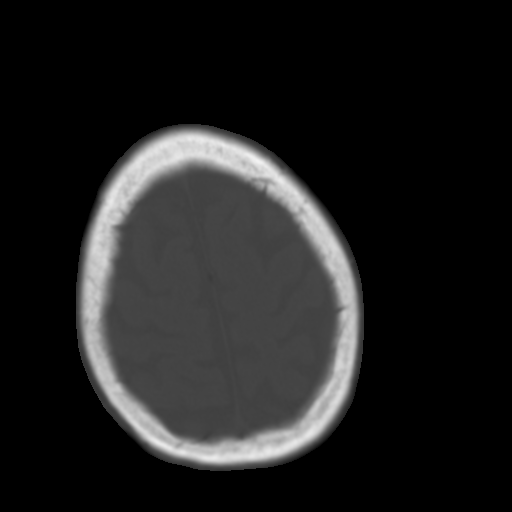
[im 26/36  brain]
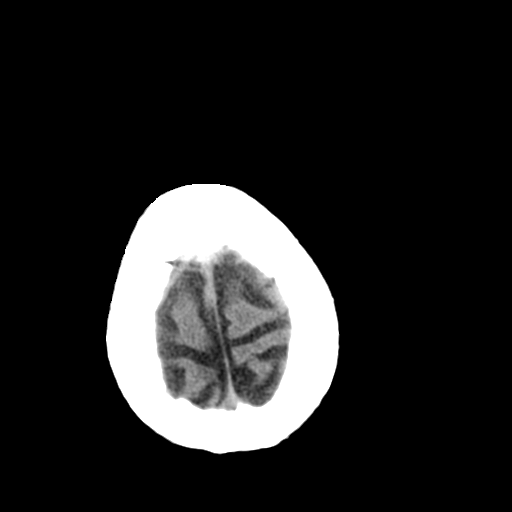
[im 28/36  brain]
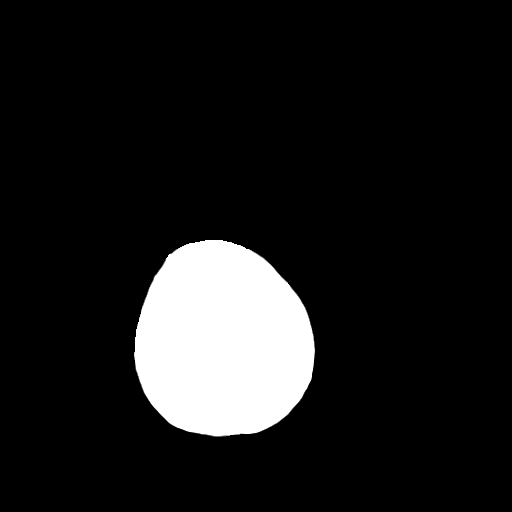
[im 31/36  brain]
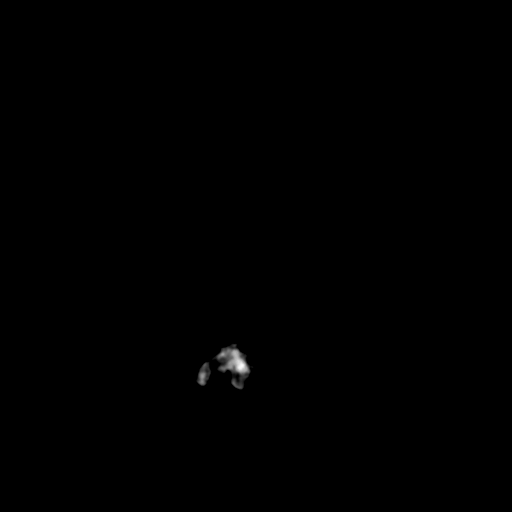
[im 33/36  brain]
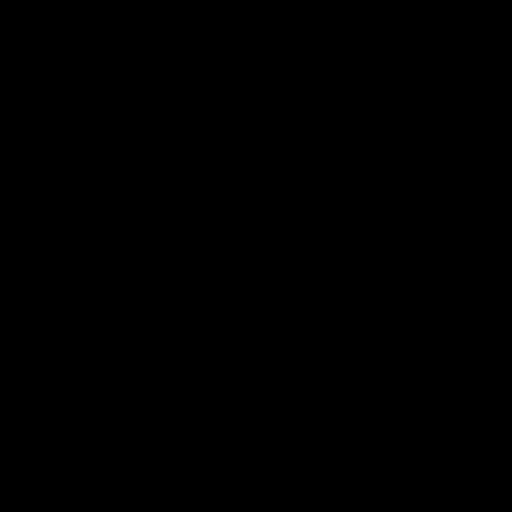
[im 33/36  bone]
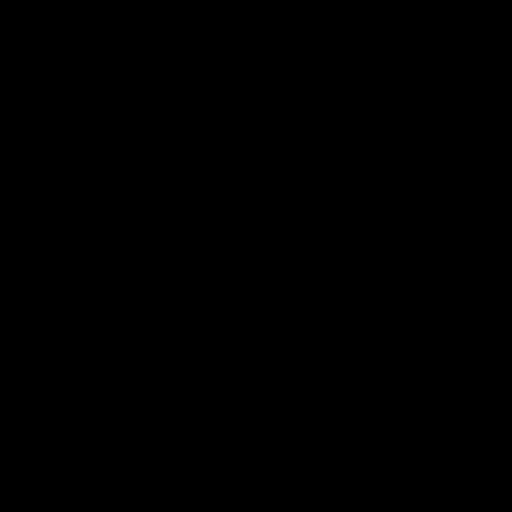

[13 of 30 positions shown; findings below may reference images not displayed]

FINDINGS: Moderate diffuse atrophy is stable. There is no intracranial mass,
hemorrhage, extra-axial fluid collection, or midline shift. There is
slight small vessel disease in the centra semiovale bilaterally.
Gray-white compartments elsewhere appear normal. No acute appearing
infarct.

There is an expansile mass arising from the left ethmoid sinus which
is destroying the medial wall of the left orbit with extension of
this lesion into the left orbit. This expansile mass measures 2.8 x
2.5 cm. This mass deviates the left medial rectus muscle laterally.
It is difficult to ascertain whether this lesion actually invades
the medial rectus muscle on the left.

The bony calvarium appears intact.  The mastoid air cells are clear.
IMPRESSION: There is a 2.8 x 2.5 cm expansile mass arising from the left
anterior ethmoid air cell complex which is causing destruction of
the medial left orbital wall with invasion into the left orbit. This
mass either deviates or frankly invades the medial rectus muscle on
the left. Differential considerations for this mass include mucocele
versus malignancy; ENT evaluation with respect to this apparently
aggressive mass is certainly warranted.

There is moderate atrophy with slight periventricular small vessel
disease. There is no intracranial mass. No hemorrhage, extra-axial
fluid, or acute appearing infarct.

Critical Value/emergent results were called by telephone at the time
of interpretation on 01/18/2014 at [DATE] to Dr. JHEMBOY PADAM ,
who verbally acknowledged these results.
